# Patient Record
Sex: Male | Born: 1955
Health system: Southern US, Community
[De-identification: ages and names within clinical notes are randomized; demographics above are authoritative.]

## PROBLEM LIST (undated history)

## (undated) DIAGNOSIS — N181 Chronic kidney disease, stage 1: Secondary | ICD-10-CM

## (undated) DIAGNOSIS — I429 Cardiomyopathy, unspecified: Secondary | ICD-10-CM

## (undated) DIAGNOSIS — Z8781 Personal history of (healed) traumatic fracture: Secondary | ICD-10-CM

## (undated) DIAGNOSIS — K219 Gastro-esophageal reflux disease without esophagitis: Secondary | ICD-10-CM

## (undated) DIAGNOSIS — I10 Essential (primary) hypertension: Secondary | ICD-10-CM

## (undated) DIAGNOSIS — K5790 Diverticulosis of intestine, part unspecified, without perforation or abscess without bleeding: Secondary | ICD-10-CM

## (undated) DIAGNOSIS — I639 Cerebral infarction, unspecified: Secondary | ICD-10-CM

## (undated) DIAGNOSIS — I714 Abdominal aortic aneurysm, without rupture, unspecified: Secondary | ICD-10-CM

## (undated) DIAGNOSIS — I219 Acute myocardial infarction, unspecified: Secondary | ICD-10-CM

## (undated) DIAGNOSIS — J439 Emphysema, unspecified: Secondary | ICD-10-CM

## (undated) DIAGNOSIS — I5020 Unspecified systolic (congestive) heart failure: Secondary | ICD-10-CM

## (undated) DIAGNOSIS — D696 Thrombocytopenia, unspecified: Secondary | ICD-10-CM

## (undated) DIAGNOSIS — R748 Abnormal levels of other serum enzymes: Secondary | ICD-10-CM

## (undated) DIAGNOSIS — M5030 Other cervical disc degeneration, unspecified cervical region: Secondary | ICD-10-CM

## (undated) DIAGNOSIS — Z720 Tobacco use: Secondary | ICD-10-CM

## (undated) DIAGNOSIS — R972 Elevated prostate specific antigen [PSA]: Secondary | ICD-10-CM

## (undated) DIAGNOSIS — F419 Anxiety disorder, unspecified: Secondary | ICD-10-CM

## (undated) DIAGNOSIS — R001 Bradycardia, unspecified: Secondary | ICD-10-CM

## (undated) DIAGNOSIS — E785 Hyperlipidemia, unspecified: Secondary | ICD-10-CM

## (undated) DIAGNOSIS — G629 Polyneuropathy, unspecified: Secondary | ICD-10-CM

## (undated) DIAGNOSIS — F1010 Alcohol abuse, uncomplicated: Secondary | ICD-10-CM

## (undated) DIAGNOSIS — S2222XA Fracture of body of sternum, initial encounter for closed fracture: Secondary | ICD-10-CM

## (undated) DIAGNOSIS — I739 Peripheral vascular disease, unspecified: Secondary | ICD-10-CM

## (undated) DIAGNOSIS — C801 Malignant (primary) neoplasm, unspecified: Secondary | ICD-10-CM

## (undated) DIAGNOSIS — I4901 Ventricular fibrillation: Secondary | ICD-10-CM

## (undated) DIAGNOSIS — I2510 Atherosclerotic heart disease of native coronary artery without angina pectoris: Secondary | ICD-10-CM

## (undated) DIAGNOSIS — K7460 Unspecified cirrhosis of liver: Secondary | ICD-10-CM

## (undated) HISTORY — PX: CORONARY ANGIOPLASTY: SHX604

## (undated) HISTORY — DX: Hyperlipidemia, unspecified: E78.5

## (undated) HISTORY — DX: Cerebral infarction, unspecified: I63.9

## (undated) HISTORY — DX: Essential (primary) hypertension: I10

## (undated) HISTORY — PX: PROSTATE BIOPSY: SHX241

## (undated) HISTORY — DX: Malignant (primary) neoplasm, unspecified: C80.1

## (undated) HISTORY — DX: Atherosclerotic heart disease of native coronary artery without angina pectoris: I25.10

## (undated) HISTORY — PX: HERNIA REPAIR: SHX51

## (undated) HISTORY — DX: Acute myocardial infarction, unspecified: I21.9

## (undated) HISTORY — PX: COLONOSCOPY: SHX174

---

## 2005-04-21 DIAGNOSIS — I219 Acute myocardial infarction, unspecified: Secondary | ICD-10-CM

## 2005-04-21 HISTORY — DX: Acute myocardial infarction, unspecified: I21.9

## 2005-11-29 DIAGNOSIS — I2109 ST elevation (STEMI) myocardial infarction involving other coronary artery of anterior wall: Secondary | ICD-10-CM

## 2005-11-29 DIAGNOSIS — I2510 Atherosclerotic heart disease of native coronary artery without angina pectoris: Secondary | ICD-10-CM

## 2005-11-29 DIAGNOSIS — I4901 Ventricular fibrillation: Secondary | ICD-10-CM

## 2005-11-29 HISTORY — DX: Atherosclerotic heart disease of native coronary artery without angina pectoris: I25.10

## 2005-11-29 HISTORY — DX: ST elevation (STEMI) myocardial infarction involving other coronary artery of anterior wall: I21.09

## 2005-11-29 HISTORY — DX: Ventricular fibrillation: I49.01

## 2005-12-08 HISTORY — PX: OTHER SURGICAL HISTORY: SHX169

## 2006-01-02 ENCOUNTER — Ambulatory Visit: Payer: Self-pay | Admitting: Internal Medicine

## 2006-01-19 ENCOUNTER — Ambulatory Visit: Payer: Self-pay | Admitting: General Surgery

## 2010-04-21 DIAGNOSIS — C189 Malignant neoplasm of colon, unspecified: Secondary | ICD-10-CM

## 2010-04-21 DIAGNOSIS — C801 Malignant (primary) neoplasm, unspecified: Secondary | ICD-10-CM

## 2010-04-21 HISTORY — DX: Malignant (primary) neoplasm, unspecified: C80.1

## 2010-04-21 HISTORY — DX: Malignant neoplasm of colon, unspecified: C18.9

## 2010-04-21 HISTORY — PX: PROSTATE SURGERY: SHX751

## 2010-04-21 HISTORY — PX: COLON SURGERY: SHX602

## 2010-05-15 ENCOUNTER — Ambulatory Visit: Payer: Self-pay | Admitting: Internal Medicine

## 2010-06-10 ENCOUNTER — Ambulatory Visit: Payer: Self-pay | Admitting: Gastroenterology

## 2010-06-13 ENCOUNTER — Ambulatory Visit: Payer: Self-pay | Admitting: Gastroenterology

## 2010-06-19 ENCOUNTER — Ambulatory Visit: Payer: Self-pay | Admitting: Surgery

## 2010-06-20 LAB — CEA: CEA: 2 ng/mL (ref 0.0–4.7)

## 2010-06-26 ENCOUNTER — Inpatient Hospital Stay: Payer: Self-pay | Admitting: Surgery

## 2010-07-02 ENCOUNTER — Ambulatory Visit: Payer: Self-pay | Admitting: Internal Medicine

## 2010-07-10 ENCOUNTER — Ambulatory Visit: Payer: Self-pay | Admitting: Surgery

## 2010-07-21 ENCOUNTER — Ambulatory Visit: Payer: Self-pay | Admitting: Internal Medicine

## 2010-07-30 ENCOUNTER — Ambulatory Visit: Payer: Self-pay | Admitting: Internal Medicine

## 2010-08-20 ENCOUNTER — Ambulatory Visit: Payer: Self-pay | Admitting: Internal Medicine

## 2011-06-29 IMAGING — CR DG CHEST 2V
1 series · 2 of 2 positions shown · non-contrast
Comparison: none

REASON FOR EXAM: shortness of breath, increased O2 requirements
COMMENTS:   LMP: (Male)

[Series 1: view not recorded · 0.17mm/px · 2 of 2 slices shown]
[im 1/2]
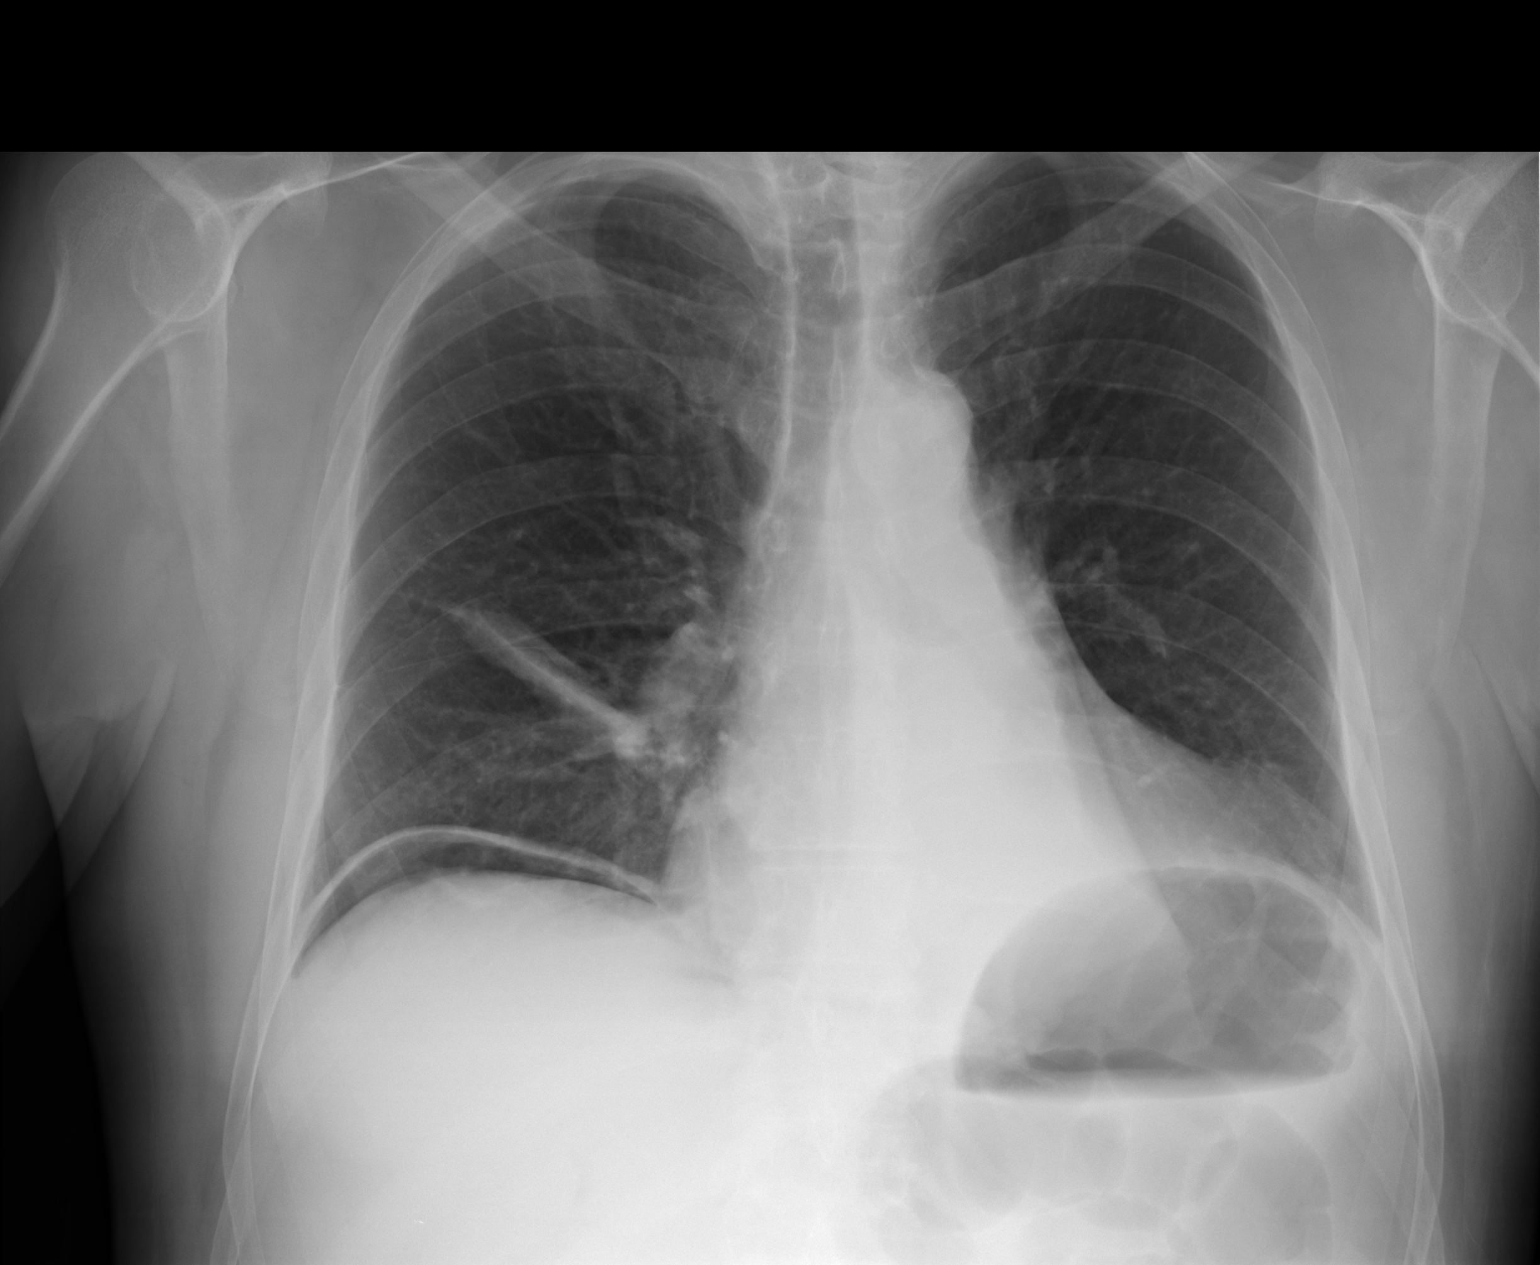
[im 2/2]
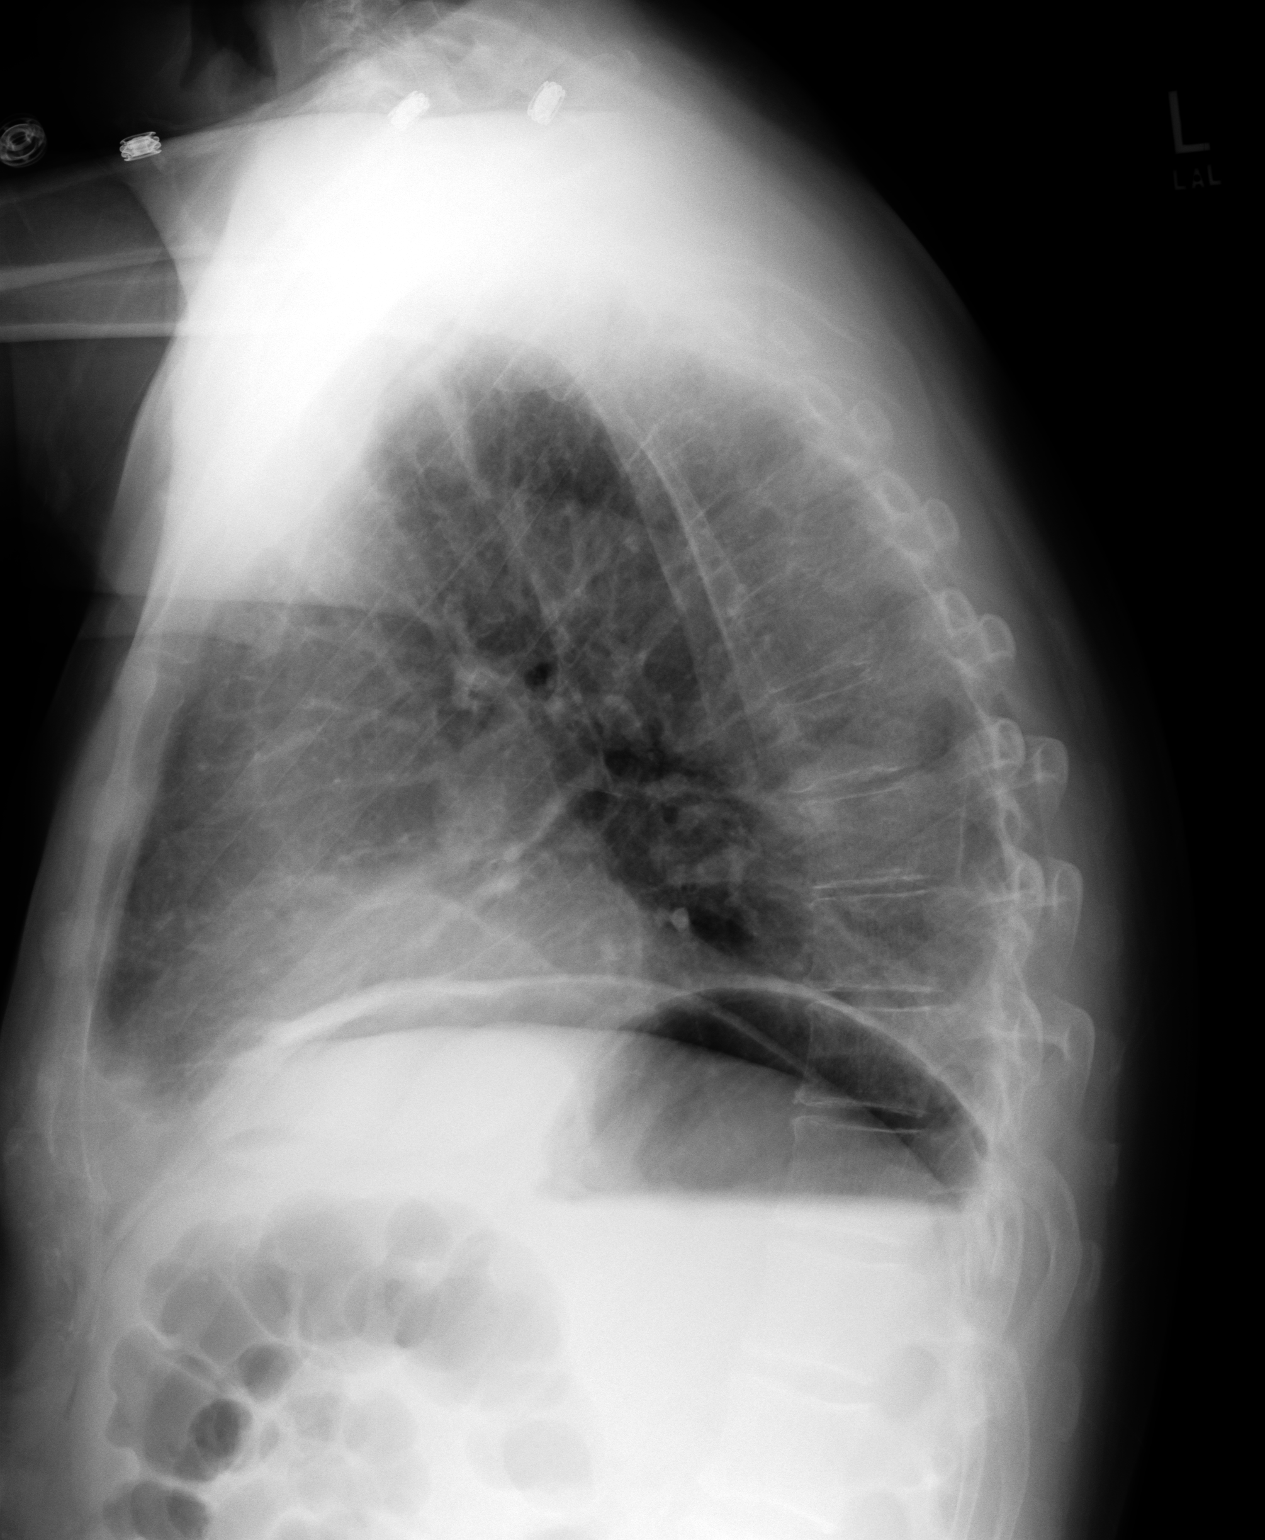

[2 of 2 positions shown; findings below may reference images not displayed]

PROCEDURE:     DXR - DXR CHEST PA (OR AP) AND LATERAL  - June 28, 2010  [DATE]

RESULT:     Erect PA and lateral views the chest demonstrate free air under
the right hemidiaphragm presumably secondary to recent surgery. There is a
band of density in the right hilar region suggestive of atelectasis. Minimal
infiltrate is not excluded. There is no effusion or pneumothorax. The heart
is normal in size.
IMPRESSION: 1. Pneumoperitoneum, possibly secondary to recent surgery. Correlate with
history. Linear atelectasis in the right lung.(*)

## 2013-05-13 ENCOUNTER — Ambulatory Visit: Payer: Self-pay | Admitting: Gastroenterology

## 2013-05-17 LAB — PATHOLOGY REPORT

## 2014-05-22 LAB — PSA

## 2014-11-27 ENCOUNTER — Other Ambulatory Visit: Payer: Self-pay | Admitting: Family Medicine

## 2014-12-17 ENCOUNTER — Other Ambulatory Visit: Payer: Self-pay | Admitting: Family Medicine

## 2014-12-18 NOTE — Telephone Encounter (Signed)
Due for follow up. Will get him enough to get him to PE appt once booked.

## 2014-12-18 NOTE — Telephone Encounter (Signed)
Called and left a message for patient to call and schedule a follow up appointment.

## 2014-12-19 NOTE — Telephone Encounter (Signed)
Pt scheduled for 12/21/14 @ 3:30pm. Thanks.

## 2014-12-21 ENCOUNTER — Ambulatory Visit (INDEPENDENT_AMBULATORY_CARE_PROVIDER_SITE_OTHER): Payer: BLUE CROSS/BLUE SHIELD | Admitting: Family Medicine

## 2014-12-21 ENCOUNTER — Encounter: Payer: Self-pay | Admitting: Family Medicine

## 2014-12-21 VITALS — BP 138/69 | HR 67 | Temp 97.9°F | Ht 72.6 in | Wt 210.6 lb

## 2014-12-21 DIAGNOSIS — Z113 Encounter for screening for infections with a predominantly sexual mode of transmission: Secondary | ICD-10-CM

## 2014-12-21 DIAGNOSIS — G629 Polyneuropathy, unspecified: Secondary | ICD-10-CM | POA: Diagnosis not present

## 2014-12-21 DIAGNOSIS — N184 Chronic kidney disease, stage 4 (severe): Secondary | ICD-10-CM

## 2014-12-21 DIAGNOSIS — N181 Chronic kidney disease, stage 1: Secondary | ICD-10-CM

## 2014-12-21 DIAGNOSIS — N189 Chronic kidney disease, unspecified: Secondary | ICD-10-CM | POA: Diagnosis not present

## 2014-12-21 DIAGNOSIS — I10 Essential (primary) hypertension: Secondary | ICD-10-CM | POA: Insufficient documentation

## 2014-12-21 DIAGNOSIS — T148 Other injury of unspecified body region: Secondary | ICD-10-CM

## 2014-12-21 DIAGNOSIS — N185 Chronic kidney disease, stage 5: Secondary | ICD-10-CM

## 2014-12-21 DIAGNOSIS — N182 Chronic kidney disease, stage 2 (mild): Secondary | ICD-10-CM

## 2014-12-21 DIAGNOSIS — E785 Hyperlipidemia, unspecified: Secondary | ICD-10-CM | POA: Diagnosis not present

## 2014-12-21 DIAGNOSIS — N183 Chronic kidney disease, stage 3 (moderate): Secondary | ICD-10-CM

## 2014-12-21 DIAGNOSIS — R972 Elevated prostate specific antigen [PSA]: Secondary | ICD-10-CM | POA: Insufficient documentation

## 2014-12-21 DIAGNOSIS — I2510 Atherosclerotic heart disease of native coronary artery without angina pectoris: Secondary | ICD-10-CM | POA: Insufficient documentation

## 2014-12-21 DIAGNOSIS — I129 Hypertensive chronic kidney disease with stage 1 through stage 4 chronic kidney disease, or unspecified chronic kidney disease: Secondary | ICD-10-CM

## 2014-12-21 DIAGNOSIS — I25119 Atherosclerotic heart disease of native coronary artery with unspecified angina pectoris: Secondary | ICD-10-CM

## 2014-12-21 DIAGNOSIS — C189 Malignant neoplasm of colon, unspecified: Secondary | ICD-10-CM

## 2014-12-21 DIAGNOSIS — K219 Gastro-esophageal reflux disease without esophagitis: Secondary | ICD-10-CM | POA: Insufficient documentation

## 2014-12-21 DIAGNOSIS — T148XXA Other injury of unspecified body region, initial encounter: Secondary | ICD-10-CM

## 2014-12-21 MED ORDER — METOPROLOL TARTRATE 100 MG PO TABS: 100.0000 mg | ORAL_TABLET | Freq: Two times a day (BID) | ORAL | Status: DC

## 2014-12-21 MED ORDER — DULOXETINE HCL 60 MG PO CPEP: 60.0000 mg | ORAL_CAPSULE | Freq: Every day | ORAL | Status: DC

## 2014-12-21 MED ORDER — NITROGLYCERIN 0.6 MG SL SUBL: 0.6000 mg | SUBLINGUAL_TABLET | SUBLINGUAL | Status: AC | PRN

## 2014-12-21 MED ORDER — SIMVASTATIN 40 MG PO TABS: 40.0000 mg | ORAL_TABLET | Freq: Every day | ORAL | Status: DC

## 2014-12-21 MED ORDER — GABAPENTIN 300 MG PO CAPS: ORAL_CAPSULE | ORAL | Status: DC

## 2014-12-21 MED ORDER — OMEPRAZOLE 40 MG PO CPDR: 40.0000 mg | DELAYED_RELEASE_CAPSULE | Freq: Every day | ORAL | Status: DC

## 2014-12-21 NOTE — Assessment & Plan Note (Signed)
Under good control today. Checking labs. Continue current regimen. Unclear why not on ACE/ARB- will await microalbumin and look back at previous records.

## 2014-12-21 NOTE — Progress Notes (Signed)
BP 138/69 mmHg  Pulse 67  Temp(Src) 97.9 F (36.6 C)  Ht 6' 0.6" (1.844 m)  Wt 210 lb 9.6 oz (95.528 kg)  BMI 28.09 kg/m2  SpO2 98%   Subjective:    Patient ID: Michael Bonilla, male    DOB: [birth year], 59 y.o.   MRN: [id number]  HPI: Michael Bonilla is a 59 y.o. male  Chief Complaint  Patient presents with  . Medication Refill    pt states he has some questions about meds and needs refills  . Insect Bite    pt has tick bite(about a year old) on back and states he doesn't think it healed right   HYPERTENSION / Glide Satisfied with current treatment? yes Duration of hypertension: chronic BP monitoring frequency: not checking BP medication side effects: no Duration of hyperlipidemia: chronic Cholesterol medication side effects: no Cholesterol supplements: fish oil Medication compliance: excellent compliance Aspirin: yes Recent stressors: no Recurrent headaches: no Visual changes: no Palpitations: no Dyspnea: no Chest pain: no Lower extremity edema: no Dizzy/lightheaded: no   NEUROPATHY Neuropathy status:  stable    Satisfied with current treatment?:  yes   Medication side effects:  no   Medication compliance:  excellent compliance   Location:   R leg Pain:  yes tingly  Quality:  burning, pins and needles  Severity:  moderate  Frequency:  intermittent- worse at night than during the day  Bilateral:  no  Symmetric:  no  Numbness:  yes   Decreased sensation:  yes    Weakness:  no    Context:  symptoms are stable.  Alleviating factors:   at night and laying down Aggravating factors:   stretching Treatments attempted:  gabapentin and duloxetine    Taking the gabapentin- helps with the pain, cymbalta also helping  Tick bite about a year ago, has had an area that didn't heal great since then. Would like it looked at.   Relevant past medical, surgical, family and social history reviewed and updated as indicated. Interim medical history since our last  visit reviewed. Allergies and medications reviewed and updated.  Review of Systems  Constitutional: Negative.   Respiratory: Negative.   Cardiovascular: Negative.   Gastrointestinal: Negative.   Skin: Positive for wound. Negative for color change, pallor and rash.  Neurological: Negative.   Psychiatric/Behavioral: Negative.     Per HPI unless specifically indicated above     Objective:    BP 138/69 mmHg  Pulse 67  Temp(Src) 97.9 F (36.6 C)  Ht 6' 0.6" (1.844 m)  Wt 210 lb 9.6 oz (95.528 kg)  BMI 28.09 kg/m2  SpO2 98%  Wt Readings from Last 3 Encounters:  12/21/14 210 lb 9.6 oz (95.528 kg)  06/20/14 203 lb (92.08 kg)    Physical Exam  Constitutional: He is oriented to person, place, and time. He appears well-developed and well-nourished. No distress.  HENT:  Head: Normocephalic and atraumatic.  Right Ear: Hearing normal.  Left Ear: Hearing normal.  Nose: Nose normal.  Eyes: Conjunctivae and lids are normal. Right eye exhibits no discharge. Left eye exhibits no discharge. No scleral icterus.  Cardiovascular: Normal rate, regular rhythm, normal heart sounds and intact distal pulses.  Exam reveals no gallop and no friction rub.   No murmur heard. Pulmonary/Chest: Effort normal and breath sounds normal. No respiratory distress. He has no wheezes. He has no rales. He exhibits no tenderness.  Musculoskeletal: Normal range of motion.  Neurological: He is alert and oriented to person, place,  and time.  Skin: Skin is warm, dry and intact. No rash noted. No erythema. No pallor.  Excoriated area in center of upper back, no sign of infection, healing well  Psychiatric: He has a normal mood and affect. His speech is normal and behavior is normal. Judgment and thought content normal. Cognition and memory are normal.  Nursing note and vitals reviewed.   Results for orders placed or performed in visit on 12/21/14  PSA  Result Value Ref Range   PSA from PP       Assessment &  Plan:   Problem List Items Addressed This Visit      Cardiovascular and Mediastinum   Essential hypertension   Relevant Medications   metoprolol (LOPRESSOR) 100 MG tablet   nitroGLYCERIN (NITROSTAT) 0.6 MG SL tablet   simvastatin (ZOCOR) 40 MG tablet   Other Relevant Orders   Comprehensive metabolic panel   CBC With Differential/Platelet   Microalbumin, Urine Waived   TSH     Nervous and Auditory   Peripheral neuropathy    Under good control on current regimen. Continue current regimen. Continue to monitor. Refill given today.       Relevant Medications   DULoxetine (CYMBALTA) 60 MG capsule   gabapentin (NEURONTIN) 300 MG capsule     Genitourinary   Hypertensive CKD (chronic kidney disease) - Primary    Under good control today. Checking labs. Continue current regimen. Unclear why not on ACE/ARB- will await microalbumin and look back at previous records.         Other   Hyperlipidemia    Will recheck today. Continue current regimen for now. Refills given. Continue to monitor.       Relevant Medications   metoprolol (LOPRESSOR) 100 MG tablet   nitroGLYCERIN (NITROSTAT) 0.6 MG SL tablet   simvastatin (ZOCOR) 40 MG tablet   Other Relevant Orders   Lipid Panel Piccolo, Waived   Comprehensive metabolic panel   Elevated prostate specific antigen (PSA)    Will recheck today.        Other Visit Diagnoses    Elevated PSA        Relevant Orders    CBC With Differential/Platelet    PSA    Routine screening for STI (sexually transmitted infection)        Hep C checked today due to age.     Relevant Orders    Hepatitis C Antibody    Excoriation        Advised patient to use neosporin and bandaid and not scratch it. If not getting better, let us know.         Follow up plan: Return in about 6 months (around 06/20/2015) for PE.

## 2014-12-21 NOTE — Patient Instructions (Signed)

## 2014-12-21 NOTE — Assessment & Plan Note (Signed)
Will recheck today.

## 2014-12-21 NOTE — Assessment & Plan Note (Signed)
Under good control on current regimen. Continue current regimen. Continue to monitor. Refill given today.

## 2014-12-21 NOTE — Assessment & Plan Note (Signed)
Will recheck today. Continue current regimen for now. Refills given. Continue to monitor.

## 2014-12-22 LAB — COMPREHENSIVE METABOLIC PANEL
A/G RATIO: 1.8 (ref 1.1–2.5)
ALBUMIN: 4.1 g/dL (ref 3.6–4.8)
ALT: 55 IU/L — ABNORMAL HIGH (ref 0–44)
AST: 51 IU/L — ABNORMAL HIGH (ref 0–40)
Alkaline Phosphatase: 80 IU/L (ref 39–117)
BILIRUBIN TOTAL: 0.4 mg/dL (ref 0.0–1.2)
BUN / CREAT RATIO: 10 (ref 10–22)
BUN: 12 mg/dL (ref 8–27)
CALCIUM: 9.3 mg/dL (ref 8.6–10.2)
CHLORIDE: 101 mmol/L (ref 97–108)
CO2: 25 mmol/L (ref 18–29)
Creatinine, Ser: 1.16 mg/dL (ref 0.76–1.27)
GFR, EST AFRICAN AMERICAN: 79 mL/min/{1.73_m2} (ref >59)
GFR, EST NON AFRICAN AMERICAN: 68 mL/min/{1.73_m2} (ref >59)
GLOBULIN, TOTAL: 2.3 g/dL (ref 1.5–4.5)
Glucose: 88 mg/dL (ref 65–99)
POTASSIUM: 3.9 mmol/L (ref 3.5–5.2)
SODIUM: 142 mmol/L (ref 134–144)
TOTAL PROTEIN: 6.4 g/dL (ref 6.0–8.5)

## 2014-12-22 LAB — CBC WITH DIFFERENTIAL/PLATELET
HEMATOCRIT: 44.7 % (ref 37.5–51.0)
Hemoglobin: 15.1 g/dL (ref 12.6–17.7)
LYMPHS ABS: 1.5 10*3/uL (ref 0.7–3.1)
LYMPHS: 22 %
MCH: 32.5 pg (ref 26.6–33.0)
MCHC: 33.8 g/dL (ref 31.5–35.7)
MCV: 96 fL (ref 79–97)
MID (ABSOLUTE): 0.8 10*3/uL (ref 0.1–1.6)
MID: 11 %
Neutrophils Absolute: 4.8 10*3/uL (ref 1.4–7.0)
Neutrophils: 67 %
Platelets: 124 10*3/uL — ABNORMAL LOW (ref 150–379)
RBC: 4.65 x10E6/uL (ref 4.14–5.80)
RDW: 14.1 % (ref 12.3–15.4)
WBC: 7.1 10*3/uL (ref 3.4–10.8)

## 2014-12-22 LAB — PSA: Prostate Specific Ag, Serum: 5.6 ng/mL — ABNORMAL HIGH (ref 0.0–4.0)

## 2014-12-22 LAB — MICROALBUMIN, URINE WAIVED
CREATININE, URINE WAIVED: 200 mg/dL (ref 10–300)
Microalb, Ur Waived: 30 mg/L — ABNORMAL HIGH (ref 0–19)

## 2014-12-22 LAB — HEPATITIS C ANTIBODY

## 2014-12-22 LAB — TSH: TSH: 1.71 u[IU]/mL (ref 0.450–4.500)

## 2014-12-23 LAB — LIPID PANEL W/O CHOL/HDL RATIO
CHOLESTEROL TOTAL: 140 mg/dL (ref 100–199)
HDL: 29 mg/dL — AB (ref >39)
LDL CALC: 32 mg/dL (ref 0–99)
TRIGLYCERIDES: 394 mg/dL — AB (ref 0–149)
VLDL CHOLESTEROL CAL: 79 mg/dL — AB (ref 5–40)

## 2014-12-29 LAB — SPECIMEN STATUS REPORT

## 2015-01-01 ENCOUNTER — Telehealth: Payer: Self-pay | Admitting: Family Medicine

## 2015-01-01 NOTE — Telephone Encounter (Signed)
Discussed results. Just got 90 day supply of simvastatin, so doesn't want to change. Will work on diet and exercise for 6 months and will recheck, if still high, will change medicine.

## 2015-01-03 ENCOUNTER — Telehealth: Payer: Self-pay | Admitting: Family Medicine

## 2015-01-03 NOTE — Telephone Encounter (Signed)
Spoke with patient, he had a question about getting a medication for a painful achilles tendon, patient has never mentioned this to the provider, so he was scheduled for an appointment.

## 2015-01-03 NOTE — Telephone Encounter (Signed)
Pt had questions about medications and would like a call back for clarification

## 2015-01-12 ENCOUNTER — Ambulatory Visit (INDEPENDENT_AMBULATORY_CARE_PROVIDER_SITE_OTHER): Payer: BLUE CROSS/BLUE SHIELD | Admitting: Family Medicine

## 2015-01-12 ENCOUNTER — Encounter: Payer: Self-pay | Admitting: Family Medicine

## 2015-01-12 VITALS — BP 137/80 | HR 72 | Temp 97.9°F | Ht 72.0 in | Wt 210.0 lb

## 2015-01-12 DIAGNOSIS — M7662 Achilles tendinitis, left leg: Secondary | ICD-10-CM

## 2015-01-12 DIAGNOSIS — Z23 Encounter for immunization: Secondary | ICD-10-CM | POA: Diagnosis not present

## 2015-01-12 DIAGNOSIS — R609 Edema, unspecified: Secondary | ICD-10-CM

## 2015-01-12 MED ORDER — DICLOFENAC SODIUM 1 % TD GEL
4.0000 g | Freq: Four times a day (QID) | TRANSDERMAL | Status: DC
Start: 2015-01-12 — End: 2015-07-03

## 2015-01-12 NOTE — Progress Notes (Signed)
BP 137/80 mmHg  Pulse 72  Temp(Src) 97.9 F (36.6 C)  Ht 6' (1.829 m)  Wt 210 lb (95.255 kg)  BMI 28.47 kg/m2  SpO2 98%   Subjective:    Patient ID: Michael Bonilla, male    DOB: 30-Nov-1955, 59 y.o.   MRN: [id number]  HPI: Michael Bonilla is a 59 y.o. male  Chief Complaint  Patient presents with  . Foot Pain    patient complains of achilles pain left   FOOT PAIN- feels really tight in his achilles tendon Duration: about a month Involved foot: left Mechanism of injury: unknown Location: L achilles tendon Onset: gradual  Severity: mild  Quality:  dull, aching and pulling Frequency: intermittent worse as the day goes on Radiation: no Aggravating factors: weight bearing and walking  Alleviating factors: ice, HEP and NSAIDs  Status: worse Treatments attempted: rest, ice, APAP, ibuprofen, aleve and HEP  Relief with NSAIDs?:  mild Weakness with weight bearing or walking: no Morning stiffness: no Swelling: yes Redness: no Bruising: no Paresthesias / decreased sensation: no  Fevers:no  Relevant past medical, surgical, family and social history reviewed and updated as indicated. Interim medical history since our last visit reviewed. Allergies and medications reviewed and updated.  Review of Systems  Constitutional: Negative.   Respiratory: Negative.   Cardiovascular: Negative.   Musculoskeletal: Positive for myalgias. Negative for back pain, joint swelling, arthralgias, gait problem and neck stiffness.  Skin: Negative.   Psychiatric/Behavioral: Negative.     Per HPI unless specifically indicated above     Objective:    BP 137/80 mmHg  Pulse 72  Temp(Src) 97.9 F (36.6 C)  Ht 6' (1.829 m)  Wt 210 lb (95.255 kg)  BMI 28.47 kg/m2  SpO2 98%  Wt Readings from Last 3 Encounters:  01/12/15 210 lb (95.255 kg)  12/21/14 210 lb 9.6 oz (95.528 kg)  06/20/14 203 lb (92.08 kg)    Physical Exam  Constitutional: He is oriented to person, place, and time. He appears  well-developed and well-nourished. No distress.  HENT:  Head: Normocephalic and atraumatic.  Right Ear: Hearing normal.  Left Ear: Hearing normal.  Nose: Nose normal.  Eyes: Conjunctivae and lids are normal. Right eye exhibits no discharge. Left eye exhibits no discharge. No scleral icterus.  Pulmonary/Chest: Effort normal. No respiratory distress.  Musculoskeletal: Normal range of motion.  Superficial phlebitis on L ankle, trace edema, FROM, tenderness to palpation of the plantar fascia and the achilles tendon, + squeeze test to achilles, not to calf, negative Homan's  Neurological: He is alert and oriented to person, place, and time.  Skin: Skin is warm, dry and intact. No rash noted. No erythema. No pallor.  Psychiatric: He has a normal mood and affect. His speech is normal and behavior is normal. Judgment and thought content normal. Cognition and memory are normal.  Nursing note and vitals reviewed.   Results for orders placed or performed in visit on 12/21/14  Comprehensive metabolic panel  Result Value Ref Range   Glucose 88 65 - 99 mg/dL   BUN 12 8 - 27 mg/dL   Creatinine, Ser 1.16 0.76 - 1.27 mg/dL   GFR calc non Af Amer 68 >59 mL/min/1.73   GFR calc Af Amer 79 >59 mL/min/1.73   BUN/Creatinine Ratio 10 10 - 22   Sodium 142 134 - 144 mmol/L   Potassium 3.9 3.5 - 5.2 mmol/L   Chloride 101 97 - 108 mmol/L   CO2 25 18 - 29 mmol/L  Calcium 9.3 8.6 - 10.2 mg/dL   Total Protein 6.4 6.0 - 8.5 g/dL   Albumin 4.1 3.6 - 4.8 g/dL   Globulin, Total 2.3 1.5 - 4.5 g/dL   Albumin/Globulin Ratio 1.8 1.1 - 2.5   Bilirubin Total 0.4 0.0 - 1.2 mg/dL   Alkaline Phosphatase 80 39 - 117 IU/L   AST 51 (H) 0 - 40 IU/L   ALT 55 (H) 0 - 44 IU/L  CBC With Differential/Platelet  Result Value Ref Range   WBC 7.1 3.4 - 10.8 x10E3/uL   RBC 4.65 4.14 - 5.80 x10E6/uL   Hemoglobin 15.1 12.6 - 17.7 g/dL   Hematocrit 44.7 37.5 - 51.0 %   MCV 96 79 - 97 fL   MCH 32.5 26.6 - 33.0 pg   MCHC 33.8 31.5  - 35.7 g/dL   RDW 14.1 12.3 - 15.4 %   Platelets 124 (L) 150 - 379 x10E3/uL   Neutrophils 67 %   Lymphs 22 %   MID 11 %   Neutrophils Absolute 4.8 1.4 - 7.0 x10E3/uL   Lymphocytes Absolute 1.5 0.7 - 3.1 x10E3/uL   MID (Absolute) 0.8 0.1 - 1.6 X10E3/uL  Microalbumin, Urine Waived  Result Value Ref Range   Microalb, Ur Waived 30 (H) 0 - 19 mg/L   Creatinine, Urine Waived 200 10 - 300 mg/dL   Microalb/Creat Ratio <30 <30 mg/g  TSH  Result Value Ref Range   TSH 1.710 0.450 - 4.500 uIU/mL  PSA  Result Value Ref Range   Prostate Specific Ag, Serum 5.6 (H) 0.0 - 4.0 ng/mL  Hepatitis C Antibody  Result Value Ref Range   Hep C Virus Ab <0.1 0.0 - 0.9 s/co ratio  Lipid Panel w/o Chol/HDL Ratio  Result Value Ref Range   Cholesterol, Total 140 100 - 199 mg/dL   Triglycerides 394 (H) 0 - 149 mg/dL   HDL 29 (L) >39 mg/dL   VLDL Cholesterol Cal 79 (H) 5 - 40 mg/dL   LDL Calculated 32 0 - 99 mg/dL  Specimen status report  Result Value Ref Range   specimen status report Comment       Assessment & Plan:   Problem List Items Addressed This Visit    None    Visit Diagnoses    Achilles tendinitis of left lower extremity    -  Primary    Will do stretches, voltaren gel and ice. Monitor and recheck in 2-3 weeks.     Immunization due        Flu shot given today.     Relevant Orders    Flu Vaccine QUAD 36+ mos PF IM (Fluarix & Fluzone Quad PF) (Completed)    Edema        Will start with compression stockings and will see how he does in 2-3 weeks.         Follow up plan: Return in about 3 weeks (around 02/02/2015).

## 2015-01-12 NOTE — Patient Instructions (Signed)
Achilles tendonitis from Patient Sports Medicine Advisor given.

## 2015-01-15 ENCOUNTER — Telehealth: Payer: Self-pay | Admitting: Family Medicine

## 2015-01-15 MED ORDER — DICLOFENAC SODIUM 75 MG PO TBEC: 75.0000 mg | DELAYED_RELEASE_TABLET | Freq: Two times a day (BID) | ORAL | Status: DC

## 2015-01-15 NOTE — Telephone Encounter (Signed)
Pt calling to check status of medication change request. Thanks.

## 2015-01-15 NOTE — Telephone Encounter (Signed)
Pt informed medication has been sent. I did not see a note in the original encounter. I apologize. Thanks.

## 2015-01-15 NOTE — Telephone Encounter (Signed)
Rx sent to his pharmacy until the PA is approved. Please let him know. Thanks!

## 2015-01-15 NOTE — Telephone Encounter (Signed)
Please let patient know that I left him a voicemail stating that she sent in a medication for him until the other is approved, written in last phone encounter.

## 2015-01-15 NOTE — Telephone Encounter (Signed)
Forward to provider

## 2015-01-15 NOTE — Telephone Encounter (Signed)
Pt called wanted to know if something different can be called in for his tendon. Pt stated RX that was written needs a prior authorization which can take several days. Wants to know if some sort of anti-inflammatory pill can sent to his pharmacy. Pharm is [facility] in [place]. Thanks.

## 2015-01-15 NOTE — Telephone Encounter (Signed)
Patient notified

## 2015-01-25 ENCOUNTER — Telehealth: Payer: Self-pay | Admitting: Family Medicine

## 2015-01-25 NOTE — Telephone Encounter (Signed)
Dan from [facility] called stated a PA is needed for pt's Voltaren gel RX. Pharm is [facility] in [place]. Thanks.

## 2015-01-30 NOTE — Telephone Encounter (Signed)
ok 

## 2015-03-03 ENCOUNTER — Other Ambulatory Visit: Payer: Self-pay | Admitting: Family Medicine

## 2015-05-08 ENCOUNTER — Other Ambulatory Visit: Payer: Self-pay | Admitting: Family Medicine

## 2015-06-22 ENCOUNTER — Other Ambulatory Visit: Payer: Self-pay | Admitting: Family Medicine

## 2015-06-29 ENCOUNTER — Encounter: Payer: BLUE CROSS/BLUE SHIELD | Admitting: Family Medicine

## 2015-07-03 ENCOUNTER — Encounter: Payer: Self-pay | Admitting: Family Medicine

## 2015-07-03 ENCOUNTER — Ambulatory Visit (INDEPENDENT_AMBULATORY_CARE_PROVIDER_SITE_OTHER): Payer: BLUE CROSS/BLUE SHIELD | Admitting: Family Medicine

## 2015-07-03 VITALS — BP 146/81 | HR 72 | Temp 97.9°F | Ht 71.6 in | Wt 215.0 lb

## 2015-07-03 DIAGNOSIS — G6289 Other specified polyneuropathies: Secondary | ICD-10-CM

## 2015-07-03 DIAGNOSIS — J029 Acute pharyngitis, unspecified: Secondary | ICD-10-CM | POA: Diagnosis not present

## 2015-07-03 MED ORDER — PREGABALIN 75 MG PO CAPS: ORAL_CAPSULE | ORAL | Status: DC

## 2015-07-03 MED ORDER — FEXOFENADINE HCL 180 MG PO TABS: 180.0000 mg | ORAL_TABLET | Freq: Every day | ORAL | Status: DC

## 2015-07-03 NOTE — Assessment & Plan Note (Signed)
Will start him on lyrica. Work on increasing. Recheck 1 month to continue titration as needed.

## 2015-07-03 NOTE — Progress Notes (Signed)
BP 146/81 mmHg  Pulse 72  Temp(Src) 97.9 F (36.6 C)  Ht 5' 11.6" (1.819 m)  Wt 215 lb (97.523 kg)  BMI 29.47 kg/m2  SpO2 99%   Subjective:    Patient ID: Michael Bonilla, male    DOB: 08/26/1955, 60 y.o.   MRN: [id number]  HPI: Michael Bonilla is a 60 y.o. male  Chief Complaint  Patient presents with  . Sore Throat  . Leg Pain   UPPER RESPIRATORY TRACT INFECTION- grandson had strep about a week ago Duration: 1-2 weeks Worst symptom: sore throat, tight and tender Fever: yes Cough: no Shortness of breath: no Wheezing: no Chest pain: no Chest tightness: no Chest congestion: no Nasal congestion: yes Runny nose: yes Post nasal drip: yes Sneezing: no Sore throat: yes Swollen glands: no Sinus pressure: yes Headache: yes Face pain: no Toothache: no Ear pain: yes left Ear pressure: no  Eyes red/itching:yes Eye drainage/crusting: no  Vomiting: no Rash: no Fatigue: yes Sick contacts: yes Strep contacts: yes  Context: worse Recurrent sinusitis: no Relief with OTC cold/cough medications: no  Treatments attempted: anti-histamine   Leg pain has been worse. Has been burning all the time. Has a spot which he rubs and pushes on and it doesn't get any better. He feels like they cymbalta is helping, but not much. Would like to start something else. Will start lyrica.   Relevant past medical, surgical, family and social history reviewed and updated as indicated. Interim medical history since our last visit reviewed. Allergies and medications reviewed and updated.  Review of Systems  Constitutional: Negative.   HENT: Positive for congestion, postnasal drip, rhinorrhea, sinus pressure and sore throat. Negative for dental problem, drooling, ear discharge, ear pain, facial swelling, hearing loss, mouth sores, nosebleeds, sneezing, tinnitus, trouble swallowing and voice change.   Respiratory: Negative.   Cardiovascular: Negative.   Musculoskeletal: Positive for arthralgias  and gait problem. Negative for myalgias, back pain, joint swelling, neck pain and neck stiffness.  Neurological: Positive for numbness. Negative for dizziness, tremors, seizures, syncope, facial asymmetry, speech difficulty, weakness and headaches.  Psychiatric/Behavioral: Negative.     Per HPI unless specifically indicated above     Objective:    BP 146/81 mmHg  Pulse 72  Temp(Src) 97.9 F (36.6 C)  Ht 5' 11.6" (1.819 m)  Wt 215 lb (97.523 kg)  BMI 29.47 kg/m2  SpO2 99%  Wt Readings from Last 3 Encounters:  07/03/15 215 lb (97.523 kg)  01/12/15 210 lb (95.255 kg)  12/21/14 210 lb 9.6 oz (95.528 kg)    Physical Exam  Constitutional: He is oriented to person, place, and time. He appears well-developed and well-nourished. No distress.  HENT:  Head: Normocephalic and atraumatic.  Right Ear: Hearing, tympanic membrane, external ear and ear canal normal.  Left Ear: Hearing, tympanic membrane, external ear and ear canal normal.  Nose: Mucosal edema and rhinorrhea present.  Mouth/Throat: Uvula is midline and mucous membranes are normal. Posterior oropharyngeal edema and posterior oropharyngeal erythema present. No oropharyngeal exudate.  Eyes: Conjunctivae, EOM and lids are normal. Pupils are equal, round, and reactive to light. Right eye exhibits no discharge. Left eye exhibits no discharge. No scleral icterus.  Neck: Normal range of motion. Neck supple. No JVD present. No tracheal deviation present. No thyromegaly present.  Cardiovascular: Normal rate, regular rhythm, normal heart sounds and intact distal pulses.  Exam reveals no gallop and no friction rub.   No murmur heard. Pulmonary/Chest: Effort normal and breath sounds normal.  No stridor. No respiratory distress. He has no wheezes. He has no rales. He exhibits no tenderness.  Musculoskeletal: Normal range of motion.  Lymphadenopathy:    He has cervical adenopathy.  Neurological: He is alert and oriented to person, place, and  time.  Skin: Skin is warm, dry and intact. No rash noted. He is not diaphoretic. No erythema. No pallor.  Psychiatric: He has a normal mood and affect. His speech is normal and behavior is normal. Judgment and thought content normal. Cognition and memory are normal.  Nursing note and vitals reviewed.   Results for orders placed or performed in visit on 12/21/14  Comprehensive metabolic panel  Result Value Ref Range   Glucose 88 65 - 99 mg/dL   BUN 12 8 - 27 mg/dL   Creatinine, Ser 1.16 0.76 - 1.27 mg/dL   GFR calc non Af Amer 68 >59 mL/min/1.73   GFR calc Af Amer 79 >59 mL/min/1.73   BUN/Creatinine Ratio 10 10 - 22   Sodium 142 134 - 144 mmol/L   Potassium 3.9 3.5 - 5.2 mmol/L   Chloride 101 97 - 108 mmol/L   CO2 25 18 - 29 mmol/L   Calcium 9.3 8.6 - 10.2 mg/dL   Total Protein 6.4 6.0 - 8.5 g/dL   Albumin 4.1 3.6 - 4.8 g/dL   Globulin, Total 2.3 1.5 - 4.5 g/dL   Albumin/Globulin Ratio 1.8 1.1 - 2.5   Bilirubin Total 0.4 0.0 - 1.2 mg/dL   Alkaline Phosphatase 80 39 - 117 IU/L   AST 51 (H) 0 - 40 IU/L   ALT 55 (H) 0 - 44 IU/L  CBC With Differential/Platelet  Result Value Ref Range   WBC 7.1 3.4 - 10.8 x10E3/uL   RBC 4.65 4.14 - 5.80 x10E6/uL   Hemoglobin 15.1 12.6 - 17.7 g/dL   Hematocrit 44.7 37.5 - 51.0 %   MCV 96 79 - 97 fL   MCH 32.5 26.6 - 33.0 pg   MCHC 33.8 31.5 - 35.7 g/dL   RDW 14.1 12.3 - 15.4 %   Platelets 124 (L) 150 - 379 x10E3/uL   Neutrophils 67 %   Lymphs 22 %   MID 11 %   Neutrophils Absolute 4.8 1.4 - 7.0 x10E3/uL   Lymphocytes Absolute 1.5 0.7 - 3.1 x10E3/uL   MID (Absolute) 0.8 0.1 - 1.6 X10E3/uL  Microalbumin, Urine Waived  Result Value Ref Range   Microalb, Ur Waived 30 (H) 0 - 19 mg/L   Creatinine, Urine Waived 200 10 - 300 mg/dL   Microalb/Creat Ratio <30 <30 mg/g  TSH  Result Value Ref Range   TSH 1.710 0.450 - 4.500 uIU/mL  PSA  Result Value Ref Range   Prostate Specific Ag, Serum 5.6 (H) 0.0 - 4.0 ng/mL  Hepatitis C Antibody  Result  Value Ref Range   Hep C Virus Ab <0.1 0.0 - 0.9 s/co ratio  Lipid Panel w/o Chol/HDL Ratio  Result Value Ref Range   Cholesterol, Total 140 100 - 199 mg/dL   Triglycerides 394 (H) 0 - 149 mg/dL   HDL 29 (L) >39 mg/dL   VLDL Cholesterol Cal 79 (H) 5 - 40 mg/dL   LDL Calculated 32 0 - 99 mg/dL  Specimen status report  Result Value Ref Range   specimen status report Comment       Assessment & Plan:   Problem List Items Addressed This Visit      Nervous and Auditory   Peripheral neuropathy (HCC)  Will start him on lyrica. Work on increasing. Recheck 1 month to continue titration as needed.       Relevant Medications   pregabalin (LYRICA) 75 MG capsule    Other Visit Diagnoses    Sore throat    -  Primary    Strep negative. Likely allergies. Will start allegra. If not better in 1 week, will let us know.     Relevant Orders    Rapid strep screen (not at [facility])        Follow up plan: Return in about 4 weeks (around 07/31/2015) for follow up on leg pain.

## 2015-07-03 NOTE — Patient Instructions (Signed)
Pregabalin capsules What is this medicine? PREGABALIN (pre GAB a lin) is used to treat nerve pain from diabetes, shingles, spinal cord injury, and fibromyalgia. It is also used to control seizures in epilepsy. This medicine may be used for other purposes; ask your health care provider or pharmacist if you have questions. What should I tell my health care provider before I take this medicine? They need to know if you have any of these conditions: -bleeding problems -heart disease, including heart failure -history of alcohol or drug abuse -kidney disease -suicidal thoughts, plans, or attempt; a previous suicide attempt by you or a family member -an unusual or allergic reaction to pregabalin, gabapentin, other medicines, foods, dyes, or preservatives -pregnant or trying to get pregnant or trying to conceive with your partner -breast-feeding How should I use this medicine? Take this medicine by mouth with a glass of water. Follow the directions on the prescription label. You can take this medicine with or without food. Take your doses at regular intervals. Do not take your medicine more often than directed. Do not stop taking except on your doctor's advice. A special MedGuide will be given to you by the pharmacist with each prescription and refill. Be sure to read this information carefully each time. Talk to your pediatrician regarding the use of this medicine in children. Special care may be needed. Overdosage: If you think you have taken too much of this medicine contact a poison control center or emergency room at once. NOTE: This medicine is only for you. Do not share this medicine with others. What if I miss a dose? If you miss a dose, take it as soon as you can. If it is almost time for your next dose, take only that dose. Do not take double or extra doses. What may interact with this medicine? -alcohol -certain medicines for blood pressure like captopril, enalapril, or  lisinopril -certain medicines for diabetes, like pioglitazone or rosiglitazone -certain medicines for anxiety or sleep -narcotic medicines for pain This list may not describe all possible interactions. Give your health care provider a list of all the medicines, herbs, non-prescription drugs, or dietary supplements you use. Also tell them if you smoke, drink alcohol, or use illegal drugs. Some items may interact with your medicine. What should I watch for while using this medicine? Tell your doctor or healthcare professional if your symptoms do not start to get better or if they get worse. Visit your doctor or health care professional for regular checks on your progress. Do not stop taking except on your doctor's advice. You may develop a severe reaction. Your doctor will tell you how much medicine to take. Wear a medical identification bracelet or chain if you are taking this medicine for seizures, and carry a card that describes your disease and details of your medicine and dosage times. You may get drowsy or dizzy. Do not drive, use machinery, or do anything that needs mental alertness until you know how this medicine affects you. Do not stand or sit up quickly, especially if you are an older patient. This reduces the risk of dizzy or fainting spells. Alcohol may interfere with the effect of this medicine. Avoid alcoholic drinks. If you have a heart condition, like congestive heart failure, and notice that you are retaining water and have swelling in your hands or feet, contact your health care provider immediately. The use of this medicine may increase the chance of suicidal thoughts or actions. Pay special attention to how you are   responding while on this medicine. Any worsening of mood, or thoughts of suicide or dying should be reported to your health care professional right away. This medicine has caused reduced sperm counts in some men. This may interfere with the ability to father a child. You  should talk to your doctor or health care professional if you are concerned about your fertility. Women who become pregnant while using this medicine for seizures may enroll in the [place] Antiepileptic Drug Pregnancy Registry by calling 1-888-233-2334. This registry collects information about the safety of antiepileptic drug use during pregnancy. What side effects may I notice from receiving this medicine? Side effects that you should report to your doctor or health care professional as soon as possible: -allergic reactions like skin rash, itching or hives, swelling of the face, lips, or tongue -breathing problems -changes in vision -chest pain -confusion -jerking or unusual movements of any part of your body -loss of memory -muscle pain, tenderness, or weakness -suicidal thoughts or other mood changes -swelling of the ankles, feet, hands -unusual bruising or bleeding Side effects that usually do not require medical attention (Report these to your doctor or health care professional if they continue or are bothersome.): -dizziness -drowsiness -dry mouth -headache -nausea -tremors -trouble sleeping -weight gain This list may not describe all possible side effects. Call your doctor for medical advice about side effects. You may report side effects to FDA at 1-800-FDA-1088. Where should I keep my medicine? Keep out of the reach of children. This medicine can be abused. Keep your medicine in a safe place to protect it from theft. Do not share this medicine with anyone. Selling or giving away this medicine is dangerous and against the law. This medicine may cause accidental overdose and death if it taken by other adults, children, or pets. Mix any unused medicine with a substance like cat litter or coffee grounds. Then throw the medicine away in a sealed container like a sealed bag or a coffee can with a lid. Do not use the medicine after the expiration date. Store at room temperature  between 15 and 30 degrees C (59 and 86 degrees F). NOTE: This sheet is a summary. It may not cover all possible information. If you have questions about this medicine, talk to your doctor, pharmacist, or health care provider.    2016, [employer]. (2013-06-03 15:38:53)  

## 2015-07-04 ENCOUNTER — Telehealth: Payer: Self-pay | Admitting: Family Medicine

## 2015-07-04 NOTE — Telephone Encounter (Signed)
pts wife called and would like to know if the pt should be still taking certain medications.

## 2015-07-05 LAB — CULTURE, GROUP A STREP: STREP A CULTURE: NEGATIVE

## 2015-07-05 LAB — RAPID STREP SCREEN (MED CTR MEBANE ONLY): Strep Gp A Ag, IA W/Reflex: NEGATIVE

## 2015-07-05 NOTE — Telephone Encounter (Signed)
Patients wife notified

## 2015-07-05 NOTE — Telephone Encounter (Signed)
Is patient to continue taking the Cymbalta and Gabapentin with the Lyrica

## 2015-07-05 NOTE — Telephone Encounter (Signed)
Yes. Because he's not better on the other 2

## 2015-07-11 ENCOUNTER — Other Ambulatory Visit: Payer: Self-pay | Admitting: Family Medicine

## 2015-08-03 ENCOUNTER — Ambulatory Visit: Payer: BLUE CROSS/BLUE SHIELD | Admitting: Family Medicine

## 2015-08-05 ENCOUNTER — Other Ambulatory Visit: Payer: Self-pay | Admitting: Family Medicine

## 2015-08-07 ENCOUNTER — Ambulatory Visit (INDEPENDENT_AMBULATORY_CARE_PROVIDER_SITE_OTHER): Payer: BLUE CROSS/BLUE SHIELD | Admitting: Family Medicine

## 2015-08-07 ENCOUNTER — Encounter: Payer: Self-pay | Admitting: Family Medicine

## 2015-08-07 VITALS — BP 130/78 | HR 68 | Temp 98.6°F | Ht 71.6 in | Wt 215.0 lb

## 2015-08-07 DIAGNOSIS — G6289 Other specified polyneuropathies: Secondary | ICD-10-CM

## 2015-08-07 MED ORDER — PREGABALIN 150 MG PO CAPS: 150.0000 mg | ORAL_CAPSULE | Freq: Two times a day (BID) | ORAL | Status: DC

## 2015-08-07 NOTE — Patient Instructions (Signed)
Week 1: 2 capsules in AM, 3 in Afternoon and at bedtime Week 2: 2 capsules in AM and in Afternoon and 3 capsules at bedtime Week 3: 2 capsules in AM and in Afternoon and at bedtime Week 4: 1 capsules in AM, 2 in Afternoon and at bedtime Week 5: 1 capsules in AM and in Afternoon and 2 capsules at bedtime Week 6: 1 capsules in AM and in Afternoon and at bedtime Week 7: Nothing in AM, 1 in Afternoon and at bedtime Week 8: Nothing in the AM Afternoon and 1 capsules at bedtime Week 9:  STOP GABAPENTIN

## 2015-08-07 NOTE — Assessment & Plan Note (Signed)
Will increase his lyrica. Will begin cutting down his gabapentin as discussed in patient instructions. Check back in in 1 month. Call with any concerns.

## 2015-08-07 NOTE — Progress Notes (Signed)
BP 130/78 mmHg  Pulse 68  Temp(Src) 98.6 F (37 C)  Ht 5' 11.6" (1.819 m)  Wt 215 lb (97.523 kg)  BMI 29.47 kg/m2  SpO2 98%   Subjective:    Patient ID: Michael Bonilla, male    DOB: 18-Mar-1956, 60 y.o.   MRN: [id number]  HPI: Michael Bonilla is a 60 y.o. male  Chief Complaint  Patient presents with  . Leg Pain    Patient states that he would like to to try and stop some of the medications that he is taking for his leg pain   NEUROPATHY, still taking 75mg  BID Neuropathy status: better  Satisfied with current treatment?: no Medication side effects: no Medication compliance:  excellent compliance Location: R leg Pain: yes Severity: mild  Quality:  Stinging and hot Frequency: intermittent Bilateral: no Symmetric: no Numbness: yes Decreased sensation: no Weakness: no Context: better Alleviating factors: Lyrica Aggravating factors: nothing Treatments attempted: gabapentin, cymbalta, lyrica  Relevant past medical, surgical, family and social history reviewed and updated as indicated. Interim medical history since our last visit reviewed. Allergies and medications reviewed and updated.  Review of Systems  Constitutional: Negative.   Respiratory: Negative.   Cardiovascular: Negative.   Psychiatric/Behavioral: Negative.     Per HPI unless specifically indicated above     Objective:    BP 130/78 mmHg  Pulse 68  Temp(Src) 98.6 F (37 C)  Ht 5' 11.6" (1.819 m)  Wt 215 lb (97.523 kg)  BMI 29.47 kg/m2  SpO2 98%  Wt Readings from Last 3 Encounters:  08/07/15 215 lb (97.523 kg)  07/03/15 215 lb (97.523 kg)  01/12/15 210 lb (95.255 kg)    Physical Exam  Constitutional: He is oriented to person, place, and time. He appears well-developed and well-nourished. No distress.  HENT:  Head: Normocephalic and atraumatic.  Right Ear: Hearing normal.  Left Ear: Hearing normal.  Nose: Nose normal.  Eyes: Conjunctivae and lids are normal. Right eye exhibits no  discharge. Left eye exhibits no discharge. No scleral icterus.  Pulmonary/Chest: Effort normal. No respiratory distress.  Musculoskeletal: Normal range of motion.  Neurological: He is alert and oriented to person, place, and time.  Skin: Skin is warm, dry and intact. No rash noted. No erythema. No pallor.  Psychiatric: He has a normal mood and affect. His speech is normal and behavior is normal. Judgment and thought content normal. Cognition and memory are normal.  Nursing note and vitals reviewed.   Results for orders placed or performed in visit on 07/03/15  Rapid strep screen (not at [facility])  Result Value Ref Range   Strep Gp A Ag, IA W/Reflex Negative Negative  Culture, Group A Strep  Result Value Ref Range   Strep A Culture Negative       Assessment & Plan:   Problem List Items Addressed This Visit      Nervous and Auditory   Peripheral neuropathy ([place]) - Primary    Will increase his lyrica. Will begin cutting down his gabapentin as discussed in patient instructions. Check back in in 1 month. Call with any concerns.       Relevant Medications   pregabalin (LYRICA) 150 MG capsule       Follow up plan: Return in about 4 weeks (around 09/04/2015) for recheck neuropathy.

## 2015-08-18 ENCOUNTER — Other Ambulatory Visit: Payer: Self-pay | Admitting: Family Medicine

## 2015-09-06 ENCOUNTER — Encounter: Payer: Self-pay | Admitting: Family Medicine

## 2015-09-06 ENCOUNTER — Ambulatory Visit (INDEPENDENT_AMBULATORY_CARE_PROVIDER_SITE_OTHER): Payer: BLUE CROSS/BLUE SHIELD | Admitting: Family Medicine

## 2015-09-06 VITALS — BP 124/76 | HR 75 | Temp 98.3°F | Wt 216.0 lb

## 2015-09-06 DIAGNOSIS — I129 Hypertensive chronic kidney disease with stage 1 through stage 4 chronic kidney disease, or unspecified chronic kidney disease: Secondary | ICD-10-CM | POA: Diagnosis not present

## 2015-09-06 DIAGNOSIS — E785 Hyperlipidemia, unspecified: Secondary | ICD-10-CM

## 2015-09-06 DIAGNOSIS — G6289 Other specified polyneuropathies: Secondary | ICD-10-CM

## 2015-09-06 DIAGNOSIS — I10 Essential (primary) hypertension: Secondary | ICD-10-CM | POA: Diagnosis not present

## 2015-09-06 DIAGNOSIS — M25572 Pain in left ankle and joints of left foot: Secondary | ICD-10-CM

## 2015-09-06 MED ORDER — DULOXETINE HCL 20 MG PO CPEP
ORAL_CAPSULE | ORAL | Status: DC
Start: 2015-09-06 — End: 2016-02-27

## 2015-09-06 NOTE — Assessment & Plan Note (Signed)
Doing much better on lyrica. Continue to wean off gabapentin. OK to start weaning off cymbalta. Call if pain increases coming off these. Continue lyrica. Call with any concerns.

## 2015-09-06 NOTE — Assessment & Plan Note (Signed)
No complaints. Will recheck labs. Continue current regimen. Recheck at physical in 6 months.

## 2015-09-06 NOTE — Progress Notes (Signed)
BP 124/76 mmHg  Pulse 75  Temp(Src) 98.3 F (36.8 C)  Wt 216 lb (97.977 kg)  SpO2 96%   Subjective:    Patient ID: Michael Bonilla, male    DOB: 06/23/1955, 60 y.o.   MRN: [id number]  HPI: Michael Bonilla is a 60 y.o. male  Chief Complaint  Patient presents with  . Neuropathy    follow up, he states it's doing better and better. Left foot still bothers him some but not much.  . Other    he wants to know if he can stop the Cymblata   NEUROPATHY- weaning off the gabapentin. Wants to come off the cymbalta. Doing very well with the lyrica. Feeling much better Neuropathy status: controlled  Satisfied with current treatment?: yes Medication side effects: no Medication compliance:  excellent compliance Location: R leg and foot Pain: yes Severity: moderate  Quality:  Tingling and shooting Frequency: constant Bilateral: no Symmetric: no Numbness: yes Decreased sensation: yes Weakness: no Context: better Alleviating factors: lyrica Aggravating factors: nothing Treatments attempted: lyrica, gabapentin, cymabalta  FOOT PAIN Duration: about 6 weeks Involved foot: left Mechanism of injury: unknown Location: lateral foot and ankle Onset: gradual  Severity: moderate  Quality:  Feels like someone jumped on it or he rolled it Frequency: constant Radiation: no Aggravating factors: nothing  Alleviating factors: ice  Status: stable Treatments attempted: rest, ice and heat  Relief with NSAIDs?:  No NSAIDs Taken Weakness with weight bearing or walking: no Morning stiffness: no Swelling: no Redness: no Bruising: no Paresthesias / decreased sensation: no  Fevers:no  HYPERTENSION / HYPERLIPIDEMIA Satisfied with current treatment? yes Duration of hypertension: chronic BP monitoring frequency: not checking BP medication side effects: no Duration of hyperlipidemia: chronic Cholesterol medication side effects: no Cholesterol supplements: none Medication compliance:  excellent compliance Aspirin: yes Recent stressors: no Recurrent headaches: no Visual changes: no Palpitations: no Dyspnea: no Chest pain: no Lower extremity edema: no Dizzy/lightheaded: no  Relevant past medical, surgical, family and social history reviewed and updated as indicated. Interim medical history since our last visit reviewed. Allergies and medications reviewed and updated.  Review of Systems  Constitutional: Negative.   Respiratory: Negative.   Cardiovascular: Negative.   Musculoskeletal: Positive for joint swelling and arthralgias. Negative for myalgias, back pain, gait problem, neck pain and neck stiffness.  Skin: Negative.   Psychiatric/Behavioral: Negative.     Per HPI unless specifically indicated above     Objective:    BP 124/76 mmHg  Pulse 75  Temp(Src) 98.3 F (36.8 C)  Wt 216 lb (97.977 kg)  SpO2 96%  Wt Readings from Last 3 Encounters:  09/06/15 216 lb (97.977 kg)  08/07/15 215 lb (97.523 kg)  07/03/15 215 lb (97.523 kg)    Physical Exam  Constitutional: He is oriented to person, place, and time. He appears well-developed and well-nourished. No distress.  HENT:  Head: Normocephalic and atraumatic.  Right Ear: Hearing normal.  Left Ear: Hearing normal.  Nose: Nose normal.  Eyes: Conjunctivae and lids are normal. Right eye exhibits no discharge. Left eye exhibits no discharge. No scleral icterus.  Cardiovascular: Normal rate, regular rhythm, normal heart sounds and intact distal pulses.  Exam reveals no gallop and no friction rub.   No murmur heard. Pulmonary/Chest: Effort normal and breath sounds normal. No respiratory distress. He has no wheezes. He has no rales. He exhibits no tenderness.  Neurological: He is alert and oriented to person, place, and time.  Skin: Skin is warm, dry and  intact. No rash noted. No erythema. No pallor.  Psychiatric: He has a normal mood and affect. His speech is normal and behavior is normal. Judgment and thought  content normal. Cognition and memory are normal.  Nursing note and vitals reviewed.  Ankle Exam: Left    Inspection: Ankle inspection normal     Tenderness to Palpation:       Proximal fibula: no    Distal fibula: no    Medial malleolus: no    Lateral malleolus: yes    Base of 5th metatarsal: yes     Anterior talofibular ligament: no    Calcaneofibular ligament: no    Posterior talofibular ligament: no    Deltoid ligament: yes    Syndesmosis: no     Range of Motion: Full ROM    Strength:  5/5 bilaterally     Special Tests:    Anterior drawer: negative    Talar tilt: negative    Syndesmosis squeeze test: negative     Gait:  mild antalgic       Assessment & Plan:   Problem List Items Addressed This Visit      Cardiovascular and Mediastinum   RESOLVED: Essential hypertension     Nervous and Auditory   Peripheral neuropathy ([place]) - Primary    Doing much better on lyrica. Continue to wean off gabapentin. OK to start weaning off cymbalta. Call if pain increases coming off these. Continue lyrica. Call with any concerns.       Relevant Medications   DULoxetine (CYMBALTA) 20 MG capsule     Genitourinary   Hypertensive CKD (chronic kidney disease)    Under good control. Continue current regimen. Rechecking labs. Continue to monitor. Return for physical in 6 months.       Relevant Orders   Comprehensive metabolic panel     Other   Hyperlipidemia    No complaints. Will recheck labs. Continue current regimen. Recheck at physical in 6 months.       Relevant Orders   Comprehensive metabolic panel   Lipid Panel w/o Chol/HDL Ratio    Other Visit Diagnoses    Left lateral ankle pain        Exercises given. Will obtain x-ray. Call if not getting better or getting worse.     Relevant Orders    DG Ankle Complete Left        Follow up plan: Return in about 6 months (around 03/08/2016) for Physical.

## 2015-09-06 NOTE — Assessment & Plan Note (Signed)
Under good control. Continue current regimen. Rechecking labs. Continue to monitor. Return for physical in 6 months.

## 2015-09-06 NOTE — Patient Instructions (Signed)
Generic Ankle Exercises EXERCISES RANGE OF MOTION (ROM) AND STRETCHING EXERCISES These exercises may help you when beginning to rehabilitate your injury. Your symptoms may resolve with or without further involvement from your physician, physical therapist or athletic trainer. While completing these exercises, remember:   Restoring tissue flexibility helps normal motion to return to the joints. This allows healthier, less painful movement and activity.  An effective stretch should be held for at least 30 seconds.  A stretch should never be painful. You should only feel a gentle lengthening or release in the stretched tissue. RANGE OF MOTION - Dorsi/Plantar Flexion  While sitting with your right / left knee straight, draw the top of your foot upwards by flexing your ankle. Then reverse the motion, pointing your toes downward.  Hold each position for __________ seconds.  After completing your first set of exercises, repeat this exercise with your knee bent. Repeat __________ times. Complete this exercise __________ times per day.  RANGE OF MOTION - Ankle Alphabet  Imagine your right / left big toe is a pen.  Keeping your hip and knee still, write out the entire alphabet with your "pen." Make the letters as large as you can without increasing any discomfort. Repeat __________ times. Complete this exercise __________ times per day.  RANGE OF MOTION - Ankle Dorsiflexion, Active Assisted   Remove shoes and sit on a chair that is preferably not on a carpeted surface.  Place right / left foot under knee. Extend your opposite leg for support.  Keeping your heel down, slide your right / left foot back toward the chair until you feel a stretch at your ankle or calf. If you do not feel a stretch, slide your bottom forward to the edge of the chair while still keeping your heel down.  Hold this stretch for __________ seconds. Repeat __________ times. Complete this stretch __________ times per day.    STRENGTHENING EXERCISES  These exercises may help you when beginning to rehabilitate your injury. They may resolve your symptoms with or without further involvement from your physician, physical therapist or athletic trainer. While completing these exercises, remember:   Muscles can gain both the endurance and the strength needed for everyday activities through controlled exercises.  Complete these exercises as instructed by your physician, physical therapist or athletic trainer. Progress the resistance and repetitions only as guided.  You may experience muscle soreness or fatigue, but the pain or discomfort you are trying to eliminate should never worsen during these exercises. If this pain does worsen, stop and make certain you are following the directions exactly. If the pain is still present after adjustments, discontinue the exercise until you can discuss the trouble with your clinician. STRENGTH - Dorsiflexors  Secure a rubber exercise band/tubing to a fixed object (table, pole) and loop the other end around your right / left foot.  Sit on the floor facing the fixed object. The band/tubing should be slightly tense when your foot is relaxed.  Slowly draw your foot back toward you using your ankle and toes.  Hold this position for __________ seconds. Slowly release the tension in the band and return your foot to the starting position. Repeat __________ times. Complete this exercise __________ times per day.  STRENGTH - Plantar-flexors  Sit with your right / left leg extended. Holding onto both ends of a rubber exercise band/tubing, loop it around the ball of your foot. Keep a slight tension in the band.  Slowly push your toes away from you, pointing   them downward.  Hold this position for __________ seconds. Return slowly, controlling the tension in the band/tubing. Repeat __________ times. Complete this exercise __________ times per day.  STRENGTH - Ankle Eversion  Secure one end of  a rubber exercise band/tubing to a fixed object (table, pole). Loop the other end around your foot just before your toes.  Place your fists between your knees. This will focus your strengthening at your ankle.  Drawing the band/tubing across your opposite foot, slowly, pull your little toe out and up. Make sure the band/tubing is positioned to resist the entire motion.  Hold this position for __________ seconds.  Have your muscles resist the band/tubing as it slowly pulls your foot back to the starting position. Repeat __________ times. Complete this exercise __________ times per day.  STRENGTH - Ankle Inversion  Secure one end of a rubber exercise band/tubing to a fixed object (table, pole). Loop the other end around your foot just before your toes.  Place your fists between your knees. This will focus your strengthening at your ankle.  Slowly, pull your big toe up and in, making sure the band/tubing is positioned to resist the entire motion.  Hold this position for __________ seconds.  Have your muscles resist the band/tubing as it slowly pulls your foot back to the starting position. Repeat __________ times. Complete this exercises __________ times per day.  STRENGTH - Towel Curls  Sit in a chair positioned on a non-carpeted surface.  Place your foot on a towel, keeping your heel on the floor.  Pull the towel toward your heel by only curling your toes. Keep your heel on the floor. If instructed by your physician, physical therapist or athletic trainer, add weight to the end of the towel. Repeat __________ times. Complete this exercise __________ times per day. STRENGTH - Plantar-flexors, Standing  Stand with your feet shoulder width apart. Steady yourself with a wall or table using as little support as needed.  Keeping your weight evenly spread over the width of your feet, rise up on your toes.*  Hold this position for __________ seconds. Repeat __________ times. Complete  this exercise __________ times per day.  *If this is too easy, shift your weight toward your right / left leg until you feel challenged. Ultimately, you may be asked to do this exercise with your right / left foot only. BALANCE - Tandem Walking  Place your uninjured foot on a line 2-4 inches wide and at least 10 feet long.  Keeping your balance without using anything for extra support, place your right / left heel directly in front of your other foot.  Slowly raise your back foot up, lifting from the heel to the toes, and place it directly in front of the right / left foot.  Continue to walk along the line slowly. Walk for ____________________ feet. Repeat ____________________ times. Complete ____________________ times per day.   This information is not intended to replace advice given to you by your health care provider. Make sure you discuss any questions you have with your health care provider.   Document Released: 02/19/2005 Document Revised: 04/28/2014 Document Reviewed: 07/20/2008 Elsevier Interactive Patient Education 2016 [employer] Generic Ankle Exercises EXERCISES RANGE OF MOTION (ROM) AND STRETCHING EXERCISES These exercises may help you when beginning to rehabilitate your injury. Your symptoms may resolve with or without further involvement from your physician, physical therapist or athletic trainer. While completing these exercises, remember:   Restoring tissue flexibility helps normal motion to return to  the joints. This allows healthier, less painful movement and activity.  An effective stretch should be held for at least 30 seconds.  A stretch should never be painful. You should only feel a gentle lengthening or release in the stretched tissue. RANGE OF MOTION - Dorsi/Plantar Flexion  While sitting with your right / left knee straight, draw the top of your foot upwards by flexing your ankle. Then reverse the motion, pointing your toes downward.  Hold each position  for __________ seconds.  After completing your first set of exercises, repeat this exercise with your knee bent. Repeat __________ times. Complete this exercise __________ times per day.  RANGE OF MOTION - Ankle Alphabet  Imagine your right / left big toe is a pen.  Keeping your hip and knee still, write out the entire alphabet with your "pen." Make the letters as large as you can without increasing any discomfort. Repeat __________ times. Complete this exercise __________ times per day.  RANGE OF MOTION - Ankle Dorsiflexion, Active Assisted   Remove shoes and sit on a chair that is preferably not on a carpeted surface.  Place right / left foot under knee. Extend your opposite leg for support.  Keeping your heel down, slide your right / left foot back toward the chair until you feel a stretch at your ankle or calf. If you do not feel a stretch, slide your bottom forward to the edge of the chair while still keeping your heel down.  Hold this stretch for __________ seconds. Repeat __________ times. Complete this stretch __________ times per day.  STRENGTHENING EXERCISES  These exercises may help you when beginning to rehabilitate your injury. They may resolve your symptoms with or without further involvement from your physician, physical therapist or athletic trainer. While completing these exercises, remember:   Muscles can gain both the endurance and the strength needed for everyday activities through controlled exercises.  Complete these exercises as instructed by your physician, physical therapist or athletic trainer. Progress the resistance and repetitions only as guided.  You may experience muscle soreness or fatigue, but the pain or discomfort you are trying to eliminate should never worsen during these exercises. If this pain does worsen, stop and make certain you are following the directions exactly. If the pain is still present after adjustments, discontinue the exercise until you  can discuss the trouble with your clinician. STRENGTH - Dorsiflexors  Secure a rubber exercise band/tubing to a fixed object (table, pole) and loop the other end around your right / left foot.  Sit on the floor facing the fixed object. The band/tubing should be slightly tense when your foot is relaxed.  Slowly draw your foot back toward you using your ankle and toes.  Hold this position for __________ seconds. Slowly release the tension in the band and return your foot to the starting position. Repeat __________ times. Complete this exercise __________ times per day.  STRENGTH - Plantar-flexors  Sit with your right / left leg extended. Holding onto both ends of a rubber exercise band/tubing, loop it around the ball of your foot. Keep a slight tension in the band.  Slowly push your toes away from you, pointing them downward.  Hold this position for __________ seconds. Return slowly, controlling the tension in the band/tubing. Repeat __________ times. Complete this exercise __________ times per day.  STRENGTH - Ankle Eversion  Secure one end of a rubber exercise band/tubing to a fixed object (table, pole). Loop the other end around your foot just  before your toes.  Place your fists between your knees. This will focus your strengthening at your ankle.  Drawing the band/tubing across your opposite foot, slowly, pull your little toe out and up. Make sure the band/tubing is positioned to resist the entire motion.  Hold this position for __________ seconds.  Have your muscles resist the band/tubing as it slowly pulls your foot back to the starting position. Repeat __________ times. Complete this exercise __________ times per day.  STRENGTH - Ankle Inversion  Secure one end of a rubber exercise band/tubing to a fixed object (table, pole). Loop the other end around your foot just before your toes.  Place your fists between your knees. This will focus your strengthening at your  ankle.  Slowly, pull your big toe up and in, making sure the band/tubing is positioned to resist the entire motion.  Hold this position for __________ seconds.  Have your muscles resist the band/tubing as it slowly pulls your foot back to the starting position. Repeat __________ times. Complete this exercises __________ times per day.  STRENGTH - Towel Curls  Sit in a chair positioned on a non-carpeted surface.  Place your foot on a towel, keeping your heel on the floor.  Pull the towel toward your heel by only curling your toes. Keep your heel on the floor. If instructed by your physician, physical therapist or athletic trainer, add weight to the end of the towel. Repeat __________ times. Complete this exercise __________ times per day. STRENGTH - Plantar-flexors, Standing  Stand with your feet shoulder width apart. Steady yourself with a wall or table using as little support as needed.  Keeping your weight evenly spread over the width of your feet, rise up on your toes.*  Hold this position for __________ seconds. Repeat __________ times. Complete this exercise __________ times per day.  *If this is too easy, shift your weight toward your right / left leg until you feel challenged. Ultimately, you may be asked to do this exercise with your right / left foot only. BALANCE - Tandem Walking  Place your uninjured foot on a line 2-4 inches wide and at least 10 feet long.  Keeping your balance without using anything for extra support, place your right / left heel directly in front of your other foot.  Slowly raise your back foot up, lifting from the heel to the toes, and place it directly in front of the right / left foot.  Continue to walk along the line slowly. Walk for ____________________ feet. Repeat ____________________ times. Complete ____________________ times per day.   This information is not intended to replace advice given to you by your health care provider. Make sure  you discuss any questions you have with your health care provider.   Document Released: 02/19/2005 Document Revised: 04/28/2014 Document Reviewed: 07/20/2008 Elsevier Interactive Patient Education [employer].

## 2015-09-26 ENCOUNTER — Other Ambulatory Visit: Payer: Self-pay | Admitting: Family Medicine

## 2015-10-02 ENCOUNTER — Other Ambulatory Visit: Payer: Self-pay | Admitting: Family Medicine

## 2015-10-02 NOTE — Telephone Encounter (Signed)
Ok to call in

## 2015-10-11 ENCOUNTER — Other Ambulatory Visit: Payer: Self-pay | Admitting: Family Medicine

## 2015-10-11 NOTE — Telephone Encounter (Signed)
Called patient, no answer, left a message for patient to return my call.  

## 2015-10-11 NOTE — Telephone Encounter (Signed)
He's supposed to be coming off his cymbalta. Can we find out what's going on with him? Thanks!

## 2015-10-12 NOTE — Telephone Encounter (Signed)
Called patients home, no HIPPA, so unable to speak with patients wife, asked wife to have him call, she states that she will tell him, but he probably will not do it.

## 2015-10-15 NOTE — Telephone Encounter (Signed)
Dr. Wynetta Emery, what would you like me to do about this patient, since I have not been able to get in contact with him?

## 2015-12-06 ENCOUNTER — Other Ambulatory Visit: Payer: Self-pay | Admitting: Family Medicine

## 2015-12-13 ENCOUNTER — Ambulatory Visit: Payer: BLUE CROSS/BLUE SHIELD | Admitting: Family Medicine

## 2015-12-17 ENCOUNTER — Other Ambulatory Visit: Payer: Self-pay | Admitting: Family Medicine

## 2016-01-01 ENCOUNTER — Ambulatory Visit (INDEPENDENT_AMBULATORY_CARE_PROVIDER_SITE_OTHER): Payer: BLUE CROSS/BLUE SHIELD | Admitting: Family Medicine

## 2016-01-01 ENCOUNTER — Encounter: Payer: Self-pay | Admitting: Family Medicine

## 2016-01-01 DIAGNOSIS — B372 Candidiasis of skin and nail: Secondary | ICD-10-CM

## 2016-01-01 DIAGNOSIS — N5089 Other specified disorders of the male genital organs: Secondary | ICD-10-CM

## 2016-01-01 DIAGNOSIS — G6289 Other specified polyneuropathies: Secondary | ICD-10-CM | POA: Diagnosis not present

## 2016-01-01 DIAGNOSIS — Z23 Encounter for immunization: Secondary | ICD-10-CM | POA: Diagnosis not present

## 2016-01-01 MED ORDER — NYSTATIN 100000 UNIT/GM EX POWD: Freq: Four times a day (QID) | CUTANEOUS | 0 refills | Status: DC

## 2016-01-01 NOTE — Progress Notes (Signed)
There were no vitals taken for this visit.   Subjective:    Patient ID: Michael Bonilla, male    DOB: December 01, 1955, 60 y.o.   MRN: [id number]  HPI: Michael Bonilla is a 60 y.o. male  Chief Complaint  Patient presents with  . Medication Management  . Leg Pain    Right   His wife feels like he has been really irritable with her and that his memory has not been as good  NEUROPATHY Neuropathy status: controlled  Satisfied with current treatment?: yes Medication side effects: no Medication compliance:  excellent compliance Location: R leg Pain: a little bit Severity: mild  Quality:  Tingling and shooting Frequency: constant Bilateral: no Symmetric: no Numbness: yes Decreased sensation: yes Weakness: yes Context: better Alleviating factors: lyrica Aggravating factors: a lot of work Treatments attempted: gabapentin, cymabalta  Testicles have been swelling at the end of the day. No pain.   Relevant past medical, surgical, family and social history reviewed and updated as indicated. Interim medical history since our last visit reviewed. Allergies and medications reviewed and updated.  Review of Systems  Constitutional: Negative.   Respiratory: Negative.   Cardiovascular: Negative.   Psychiatric/Behavioral: Negative.     Per HPI unless specifically indicated above     Objective:    There were no vitals taken for this visit.  Wt Readings from Last 3 Encounters:  09/06/15 216 lb (98 kg)  08/07/15 215 lb (97.5 kg)  07/03/15 215 lb (97.5 kg)    Physical Exam  Constitutional: He is oriented to person, place, and time. He appears well-developed and well-nourished. No distress.  HENT:  Head: Normocephalic and atraumatic.  Right Ear: Hearing normal.  Left Ear: Hearing normal.  Nose: Nose normal.  Eyes: Conjunctivae and lids are normal. Right eye exhibits no discharge. Left eye exhibits no discharge. No scleral icterus.  Cardiovascular: Normal rate, regular rhythm,  normal heart sounds and intact distal pulses.  Exam reveals no gallop and no friction rub.   No murmur heard. Pulmonary/Chest: Effort normal and breath sounds normal. No respiratory distress. He has no wheezes. He has no rales. He exhibits no tenderness.  Abdominal: Hernia confirmed negative in the right inguinal area and confirmed negative in the left inguinal area.  Genitourinary: Testes normal and penis normal. Right testis shows no mass, no swelling and no tenderness. Right testis is descended. Cremasteric reflex is not absent on the right side. Left testis shows no mass, no swelling and no tenderness. Left testis is descended. Cremasteric reflex is not absent on the left side. No penile tenderness.  Musculoskeletal: Normal range of motion.  Neurological: He is alert and oriented to person, place, and time.  Skin: Skin is warm, dry and intact. No rash noted. No erythema. No pallor.  Psychiatric: He has a normal mood and affect. His speech is normal and behavior is normal. Judgment and thought content normal. Cognition and memory are normal.  Nursing note and vitals reviewed.   Results for orders placed or performed in visit on 07/03/15  Rapid strep screen (not at [facility])  Result Value Ref Range   Strep Gp A Ag, IA W/Reflex Negative Negative  Culture, Group A Strep  Result Value Ref Range   Strep A Culture Negative       Assessment & Plan:   Problem List Items Addressed This Visit      Nervous and Auditory   Peripheral neuropathy ([place]) - Primary    Will cut down to  2 of his lyrica a day and check in by phone in 2 weeks.        Other Visit Diagnoses    Need for influenza vaccination       Flu shot given today.   Relevant Orders   Flu Vaccine QUAD 36+ mos PF IM (Fluarix & Fluzone Quad PF) (Completed)   Need for pneumococcal vaccination       Pneumonia shot given today.   Relevant Orders   Pneumococcal conjugate vaccine 13-valent (Completed)   Scrotal edema       Likely due to  lyrica. Testicular exam normal. Call with concerns.    Candidal intertrigo       Will treat with nystatin. Rx sent to her pharmacy.   Relevant Medications   nystatin (MYCOSTATIN/NYSTOP) powder       Follow up plan: Return As scheduled.

## 2016-01-01 NOTE — Assessment & Plan Note (Signed)
Will cut down to 2 of his lyrica a day and check in by phone in 2 weeks.

## 2016-01-01 NOTE — Patient Instructions (Addendum)
Influenza (Flu) Vaccine (Inactivated or Recombinant):  1. Why get vaccinated? Influenza ("flu") is a contagious disease that spreads around the [place] every year, usually between October and May. Flu is caused by influenza viruses, and is spread mainly by coughing, sneezing, and close contact. Anyone can get flu. Flu strikes suddenly and can last several days. Symptoms vary by age, but can include:  fever/chills  sore throat  muscle aches  fatigue  cough  headache  runny or stuffy nose Flu can also lead to pneumonia and blood infections, and cause diarrhea and seizures in children. If you have a medical condition, such as heart or lung disease, flu can make it worse. Flu is more dangerous for some people. Infants and young children, people 65 years of age and older, pregnant women, and people with certain health conditions or a weakened immune system are at greatest risk. Each year thousands of people in the [place] die from flu, and many more are hospitalized. Flu vaccine can:  keep you from getting flu,  make flu less severe if you do get it, and  keep you from spreading flu to your family and other people. 2. Inactivated and recombinant flu vaccines A dose of flu vaccine is recommended every flu season. Children 6 months through 8 years of age may need two doses during the same flu season. Everyone else needs only one dose each flu season. Some inactivated flu vaccines contain a very small amount of a mercury-based preservative called thimerosal. Studies have not shown thimerosal in vaccines to be harmful, but flu vaccines that do not contain thimerosal are available. There is no live flu virus in flu shots. They cannot cause the flu. There are many flu viruses, and they are always changing. Each year a new flu vaccine is made to protect against three or four viruses that are likely to cause disease in the upcoming flu season. But even when the vaccine doesn't exactly  match these viruses, it may still provide some protection. Flu vaccine cannot prevent:  flu that is caused by a virus not covered by the vaccine, or  illnesses that look like flu but are not. It takes about 2 weeks for protection to develop after vaccination, and protection lasts through the flu season. 3. Some people should not get this vaccine Tell the person who is giving you the vaccine:  If you have any severe, life-threatening allergies. If you ever had a life-threatening allergic reaction after a dose of flu vaccine, or have a severe allergy to any part of this vaccine, you may be advised not to get vaccinated. Most, but not all, types of flu vaccine contain a small amount of egg protein.  If you ever had Guillain-Barre Syndrome (also called GBS). Some people with a history of GBS should not get this vaccine. This should be discussed with your doctor.  If you are not feeling well. It is usually okay to get flu vaccine when you have a mild illness, but you might be asked to come back when you feel better. 4. Risks of a vaccine reaction With any medicine, including vaccines, there is a chance of reactions. These are usually mild and go away on their own, but serious reactions are also possible. Most people who get a flu shot do not have any problems with it. Minor problems following a flu shot include:  soreness, redness, or swelling where the shot was given  hoarseness  sore, red or itchy eyes  cough    fever  aches  headache  itching  fatigue If these problems occur, they usually begin soon after the shot and last 1 or 2 days. More serious problems following a flu shot can include the following:  There may be a small increased risk of Guillain-Barre Syndrome (GBS) after inactivated flu vaccine. This risk has been estimated at 1 or 2 additional cases per million people vaccinated. This is much lower than the risk of severe complications from flu, which can be prevented by  flu vaccine.  Young children who get the flu shot along with pneumococcal vaccine (PCV13) and/or DTaP vaccine at the same time might be slightly more likely to have a seizure caused by fever. Ask your doctor for more information. Tell your doctor if a child who is getting flu vaccine has ever had a seizure. Problems that could happen after any injected vaccine:  People sometimes faint after a medical procedure, including vaccination. Sitting or lying down for about 15 minutes can help prevent fainting, and injuries caused by a fall. Tell your doctor if you feel dizzy, or have vision changes or ringing in the ears.  Some people get severe pain in the shoulder and have difficulty moving the arm where a shot was given. This happens very rarely.  Any medication can cause a severe allergic reaction. Such reactions from a vaccine are very rare, estimated at about 1 in a million doses, and would happen within a few minutes to a few hours after the vaccination. As with any medicine, there is a very remote chance of a vaccine causing a serious injury or death. The safety of vaccines is always being monitored. For more information, visit: www.cdc.gov/vaccinesafety/ 5. What if there is a serious reaction? What should I look for?  Look for anything that concerns you, such as signs of a severe allergic reaction, very high fever, or unusual behavior. Signs of a severe allergic reaction can include hives, swelling of the face and throat, difficulty breathing, a fast heartbeat, dizziness, and weakness. These would start a few minutes to a few hours after the vaccination. What should I do?  If you think it is a severe allergic reaction or other emergency that can't wait, call 9-1-1 and get the person to the nearest hospital. Otherwise, call your doctor.  Reactions should be reported to the Vaccine Adverse Event Reporting System (VAERS). Your doctor should file this report, or you can do it yourself through the  VAERS web site at www.vaers.hhs.gov, or by calling 1-800-822-7967. VAERS does not give medical advice. 6. The National Vaccine Injury Compensation Program The National Vaccine Injury Compensation Program (VICP) is a federal program that was created to compensate people who may have been injured by certain vaccines. Persons who believe they may have been injured by a vaccine can learn about the program and about filing a claim by calling 1-800-338-2382 or visiting the VICP website at www.hrsa.gov/vaccinecompensation. There is a time limit to file a claim for compensation. 7. How can I learn more?  Ask your healthcare provider. He or she can give you the vaccine package insert or suggest other sources of information.  Call your local or state health department.  Contact the Centers for Disease Control and Prevention (CDC):  Call 1-800-232-4636 (1-800-CDC-INFO) or  Visit CDC's website at www.cdc.gov/flu Vaccine Information Statement Inactivated Influenza Vaccine (11/25/2013)   This information is not intended to replace advice given to you by your health care provider. Make sure you discuss any questions you have with   your health care provider.   Document Released: 01/30/2006 Document Revised: 04/28/2014 Document Reviewed: 11/28/2013 Elsevier Interactive Patient Education 2016 Else[employer]. Pneumococcal Polysaccharide Vaccine: What You Need to Know 1. Why get vaccinated? Vaccination can protect older adults (and some children and younger adults) from pneumococcal disease. Pneumococcal disease is caused by bacteria that can spread from person to person through close contact. It can cause ear infections, and it can also lead to more serious infections of the:   Lungs (pneumonia),  Blood (bacteremia), and  Covering of the brain and spinal cord (meningitis). Meningitis can cause deafness and brain damage, and it can be fatal. Anyone can get pneumococcal disease, but children under 2 years  of age, people with certain medical conditions, adults over 40 years of age, and cigarette smokers are at the highest risk. About 18,000 older adults die each year from pneumococcal disease in the [place]. Treatment of pneumococcal infections with penicillin and other drugs used to be more effective. But some strains of the disease have become resistant to these drugs. This makes prevention of the disease, through vaccination, even more important. 2. Pneumococcal polysaccharide vaccine (PPSV23) Pneumococcal polysaccharide vaccine (PPSV23) protects against 23 types of pneumococcal bacteria. It will not prevent all pneumococcal disease. PPSV23 is recommended for:  All adults 47 years of age and older,  Anyone 2 through 62 years of age with certain long-term health problems,  Anyone 2 through 62 years of age with a weakened immune system,  Adults 74 through 62 years of age who smoke cigarettes or have asthma. Most people need only one dose of PPSV. A second dose is recommended for certain high-risk groups. People 68 and older should get a dose even if they have gotten one or more doses of the vaccine before they turned 65. Your healthcare provider can give you more information about these recommendations. Most healthy adults develop protection within 2 to 3 weeks of getting the shot. 3. Some people should not get this vaccine  Anyone who has had a life-threatening allergic reaction to PPSV should not get another dose.  Anyone who has a severe allergy to any component of PPSV should not receive it. Tell your provider if you have any severe allergies.  Anyone who is moderately or severely ill when the shot is scheduled may be asked to wait until they recover before getting the vaccine. Someone with a mild illness can usually be vaccinated.  Children less than 19 years of age should not receive this vaccine.  There is no evidence that PPSV is harmful to either a pregnant woman or to her  fetus. However, as a precaution, women who need the vaccine should be vaccinated before becoming pregnant, if possible. 4. Risks of a vaccine reaction With any medicine, including vaccines, there is a chance of side effects. These are usually mild and go away on their own, but serious reactions are also possible. About half of people who get PPSV have mild side effects, such as redness or pain where the shot is given, which go away within about two days. Less than 1 out of 100 people develop a fever, muscle aches, or more severe local reactions. Problems that could happen after any vaccine:  People sometimes faint after a medical procedure, including vaccination. Sitting or lying down for about 15 minutes can help prevent fainting, and injuries caused by a fall. Tell your doctor if you feel dizzy, or have vision changes or ringing in the ears.  Some people get  severe pain in the shoulder and have difficulty moving the arm where a shot was given. This happens very rarely.  Any medication can cause a severe allergic reaction. Such reactions from a vaccine are very rare, estimated at about 1 in a million doses, and would happen within a few minutes to a few hours after the vaccination. As with any medicine, there is a very remote chance of a vaccine causing a serious injury or death. The safety of vaccines is always being monitored. For more information, visit: http://www.aguilar.org/ 5. What if there is a serious reaction? What should I look for? Look for anything that concerns you, such as signs of a severe allergic reaction, very high fever, or unusual behavior.  Signs of a severe allergic reaction can include hives, swelling of the face and throat, difficulty breathing, a fast heartbeat, dizziness, and weakness. These would usually start a few minutes to a few hours after the vaccination. What should I do? If you think it is a severe allergic reaction or other emergency that can't wait, call  9-1-1 or get to the nearest hospital. Otherwise, call your doctor. Afterward, the reaction should be reported to the Vaccine Adverse Event Reporting System (VAERS). Your doctor might file this report, or you can do it yourself through the VAERS web site at www.vaers.SamedayNews.es, or by calling 516-088-7525.  VAERS does not give medical advice. 6. How can I learn more?  Ask your doctor. He or she can give you the vaccine package insert or suggest other sources of information.  Call your local or state health department.  Contact the Centers for Disease Control and Prevention (CDC):  Call 504-148-6202 (1-800-CDC-INFO) or  Visit CDC's website at http://hunter.com/ CDC Pneumococcal Polysaccharide Vaccine VIS (08/12/13)   This information is not intended to replace advice given to you by your health care provider. Make sure you discuss any questions you have with your health care provider.   Document Released: 02/02/2006 Document Revised: 04/28/2014 Document Reviewed: 08/15/2013 Elsevier Interactive Patient Education [employer].

## 2016-01-15 ENCOUNTER — Telehealth: Payer: Self-pay | Admitting: Family Medicine

## 2016-01-15 NOTE — Telephone Encounter (Signed)
Mood doing OK. Swelling has decreased. Will continue on 1 lyrica daily.

## 2016-01-15 NOTE — Telephone Encounter (Signed)
-----   Message from Valerie Roys, DO sent at 01/01/2016  4:19 PM EDT ----- Call and check in on mood and swelling

## 2016-02-07 ENCOUNTER — Other Ambulatory Visit: Payer: Self-pay | Admitting: Family Medicine

## 2016-02-27 ENCOUNTER — Ambulatory Visit (INDEPENDENT_AMBULATORY_CARE_PROVIDER_SITE_OTHER): Payer: BLUE CROSS/BLUE SHIELD | Admitting: Family Medicine

## 2016-02-27 ENCOUNTER — Encounter: Payer: Self-pay | Admitting: Family Medicine

## 2016-02-27 VITALS — BP 134/78 | HR 71 | Temp 97.7°F | Wt 218.0 lb

## 2016-02-27 DIAGNOSIS — M722 Plantar fascial fibromatosis: Secondary | ICD-10-CM | POA: Diagnosis not present

## 2016-02-27 MED ORDER — PREDNISONE 20 MG PO TABS: 40.0000 mg | ORAL_TABLET | Freq: Every day | ORAL | 0 refills | Status: DC

## 2016-02-27 NOTE — Progress Notes (Signed)
   BP 134/78   Pulse 71   Temp 97.7 F (36.5 C)   Wt 218 lb (98.9 kg)   SpO2 98%   BMI 29.90 kg/m    Subjective:    Patient ID: Michael Bonilla, male    DOB: Apr 05, 1956, 60 y.o.   MRN: [id number]  HPI: Michael Bonilla is a 60 y.o. male  Chief Complaint  Patient presents with  . Foot Pain    x 10 day, left foot, the bottom of his heel and instep hurts, throbbing and shoots up into his leg. Feels like it's bruised, slight swelling. No known injury.    10 day history of left heel pain extending to the ball of his foot. States it is a throbbing, pulling sensation that is severely painful and difficult to bear weight on. No known injury. Works on his feet all day. Wears older, worn shoes. Has been resting and elevating foot, taking ibuprofen with minimal relief. Denies fever, chills, wounds. Does have hx of peripheral neuropathy, but is decently controlled with lyrica and mostly only has sxs on right foot. States this feels different.   Relevant past medical, surgical, family and social history reviewed and updated as indicated. Interim medical history since our last visit reviewed. Allergies and medications reviewed and updated.  Review of Systems  Constitutional: Negative.   HENT: Negative.   Respiratory: Negative.   Cardiovascular: Negative.   Gastrointestinal: Negative.   Genitourinary: Negative.   Musculoskeletal: Positive for arthralgias.  Skin: Negative.   Neurological: Negative.   Psychiatric/Behavioral: Negative.     Per HPI unless specifically indicated above     Objective:    BP 134/78   Pulse 71   Temp 97.7 F (36.5 C)   Wt 218 lb (98.9 kg)   SpO2 98%   BMI 29.90 kg/m   Wt Readings from Last 3 Encounters:  02/27/16 218 lb (98.9 kg)  09/06/15 216 lb (98 kg)  08/07/15 215 lb (97.5 kg)    Physical Exam  Constitutional: He is oriented to person, place, and time. He appears well-developed and well-nourished. No distress.  HENT:  Head: Atraumatic.    Eyes: Conjunctivae are normal. Pupils are equal, round, and reactive to light. No scleral icterus.  Neck: Normal range of motion. Neck supple.  Cardiovascular: Normal rate and intact distal pulses.   Pulmonary/Chest: Effort normal. No respiratory distress.  Musculoskeletal:  Left heel moderately tender to palpation ROM painful but intact  Lymphadenopathy:    He has no cervical adenopathy.  Neurological: He is alert and oriented to person, place, and time.  Left LE neurovascularly intact  Skin: Skin is warm and dry.  Psychiatric: He has a normal mood and affect. His behavior is normal.      Assessment & Plan:   Problem List Items Addressed This Visit    None    Visit Diagnoses    Plantar fasciitis of left foot    -  Primary   Prednisone sent, recommended heel brace specifically for the condition. Tylenol for pain relief. Epsom salt soaks, gentle stretches, roll foot on tennis ball       Follow up plan: Return if symptoms worsen or fail to improve.

## 2016-02-27 NOTE — Patient Instructions (Signed)
Follow up as needed

## 2016-03-07 ENCOUNTER — Ambulatory Visit: Payer: BLUE CROSS/BLUE SHIELD | Admitting: Family Medicine

## 2016-03-20 ENCOUNTER — Ambulatory Visit: Payer: BLUE CROSS/BLUE SHIELD | Admitting: Family Medicine

## 2016-03-27 ENCOUNTER — Ambulatory Visit (INDEPENDENT_AMBULATORY_CARE_PROVIDER_SITE_OTHER): Payer: BLUE CROSS/BLUE SHIELD | Admitting: Family Medicine

## 2016-03-27 ENCOUNTER — Other Ambulatory Visit: Payer: Self-pay | Admitting: Family Medicine

## 2016-03-27 ENCOUNTER — Encounter: Payer: Self-pay | Admitting: Family Medicine

## 2016-03-27 VITALS — BP 134/81 | HR 67 | Temp 98.1°F | Wt 218.6 lb

## 2016-03-27 DIAGNOSIS — D485 Neoplasm of uncertain behavior of skin: Secondary | ICD-10-CM | POA: Diagnosis not present

## 2016-03-27 DIAGNOSIS — K219 Gastro-esophageal reflux disease without esophagitis: Secondary | ICD-10-CM

## 2016-03-27 DIAGNOSIS — G6289 Other specified polyneuropathies: Secondary | ICD-10-CM | POA: Diagnosis not present

## 2016-03-27 DIAGNOSIS — I129 Hypertensive chronic kidney disease with stage 1 through stage 4 chronic kidney disease, or unspecified chronic kidney disease: Secondary | ICD-10-CM | POA: Diagnosis not present

## 2016-03-27 DIAGNOSIS — E782 Mixed hyperlipidemia: Secondary | ICD-10-CM

## 2016-03-27 DIAGNOSIS — N181 Chronic kidney disease, stage 1: Secondary | ICD-10-CM | POA: Diagnosis not present

## 2016-03-27 DIAGNOSIS — Z720 Tobacco use: Secondary | ICD-10-CM | POA: Diagnosis not present

## 2016-03-27 DIAGNOSIS — M722 Plantar fascial fibromatosis: Secondary | ICD-10-CM | POA: Diagnosis not present

## 2016-03-27 MED ORDER — VARENICLINE TARTRATE 1 MG PO TABS: 1.0000 mg | ORAL_TABLET | Freq: Two times a day (BID) | ORAL | 1 refills | Status: DC

## 2016-03-27 MED ORDER — PREGABALIN 150 MG PO CAPS: ORAL_CAPSULE | ORAL | 1 refills | Status: DC

## 2016-03-27 MED ORDER — NYSTATIN 100000 UNIT/GM EX POWD: Freq: Four times a day (QID) | CUTANEOUS | 1 refills | Status: DC

## 2016-03-27 MED ORDER — METOPROLOL TARTRATE 100 MG PO TABS: ORAL_TABLET | ORAL | 1 refills | Status: DC

## 2016-03-27 MED ORDER — SIMVASTATIN 40 MG PO TABS
ORAL_TABLET | ORAL | 1 refills | Status: DC
Start: 2016-03-27 — End: 2016-12-09

## 2016-03-27 MED ORDER — FEXOFENADINE HCL 180 MG PO TABS: 180.0000 mg | ORAL_TABLET | Freq: Every day | ORAL | 6 refills | Status: DC

## 2016-03-27 MED ORDER — VARENICLINE TARTRATE 0.5 MG X 11 & 1 MG X 42 PO MISC: ORAL | 0 refills | Status: DC

## 2016-03-27 MED ORDER — OMEPRAZOLE 40 MG PO CPDR: DELAYED_RELEASE_CAPSULE | ORAL | 1 refills | Status: DC

## 2016-03-27 NOTE — Assessment & Plan Note (Signed)
Under good control. Continue current regimen. Continue to monitor.  

## 2016-03-27 NOTE — Assessment & Plan Note (Signed)
Rechecking levels today. Await results.  

## 2016-03-27 NOTE — Progress Notes (Signed)
BP 134/81 (BP Location: Left Arm, Patient Position: Sitting, Cuff Size: Normal)   Pulse 67   Temp 98.1 F (36.7 C)   Wt 218 lb 9.6 oz (99.2 kg)   SpO2 99%   BMI 29.98 kg/m    Subjective:    Patient ID: Michael Bonilla, male    DOB: Aug 13, 1955, 60 y.o.   MRN: [id number]  HPI: Michael Bonilla is a 60 y.o. male  Chief Complaint  Patient presents with  . Nicotine Dependence  . Plantar Fasciitis   HYPERTENSION / Roberts Satisfied with current treatment? yes Duration of hypertension: chronic BP monitoring frequency: not checking BP medication side effects: no Duration of hyperlipidemia: chronic Cholesterol medication side effects: no Cholesterol supplements: fish oil Past cholesterol medications: simvastatin (zocor) Medication compliance: excellent compliance Aspirin: yes Recent stressors: no Recurrent headaches: no Visual changes: no Palpitations: no Dyspnea: no Chest pain: no Lower extremity edema: no Dizzy/lightheaded: no   FOOT PAIN Duration: 3 months Involved foot: left Mechanism of injury: unknown Location: bottom of his foot Onset: sudden  Severity: severe  Quality:  burning, cramping and throbbing Frequency: constant Radiation: no Aggravating factors: walking  Alleviating factors: nothing  Status: worse Treatments attempted: prednisone, rest, ice, heat, APAP, ibuprofen, aleve and HEP , no brace Relief with NSAIDs?:  no Weakness with weight bearing or walking: no Morning stiffness: no Swelling: no Redness: no Bruising: yes Paresthesias / decreased sensation: no  Fevers:no  NEUROPATHY Neuropathy status: better  Satisfied with current treatment?: yes Medication side effects: no Medication compliance:  excellent compliance Location: R leg Pain: yes Severity: mild  Quality:  pins and needles Frequency: constant Bilateral: no Symmetric: no Numbness: yes Decreased sensation: yes Weakness: yes Context: stable  SMOKING  CESSATION Smoking Status: current every day smoker Smoking Amount: 1/2 ppd Smoking Onset: 60yo Smoking Quit Date: not set Smoking triggers: first thing when he wakes up Type of tobacco use: cigarettes Children in the house: no Other household members who smoke: no   Relevant past medical, surgical, family and social history reviewed and updated as indicated. Interim medical history since our last visit reviewed. Allergies and medications reviewed and updated.  Review of Systems  Constitutional: Negative.   Respiratory: Negative.   Cardiovascular: Negative.   Musculoskeletal: Positive for myalgias. Negative for arthralgias, back pain, gait problem, joint swelling, neck pain and neck stiffness.  Psychiatric/Behavioral: Negative.     Per HPI unless specifically indicated above     Objective:    BP 134/81 (BP Location: Left Arm, Patient Position: Sitting, Cuff Size: Normal)   Pulse 67   Temp 98.1 F (36.7 C)   Wt 218 lb 9.6 oz (99.2 kg)   SpO2 99%   BMI 29.98 kg/m   Wt Readings from Last 3 Encounters:  03/27/16 218 lb 9.6 oz (99.2 kg)  02/27/16 218 lb (98.9 kg)  09/06/15 216 lb (98 kg)    Physical Exam  Constitutional: He is oriented to person, place, and time. He appears well-developed and well-nourished. No distress.  HENT:  Head: Normocephalic and atraumatic.  Right Ear: Hearing normal.  Left Ear: Hearing normal.  Nose: Nose normal.  Eyes: Conjunctivae and lids are normal. Right eye exhibits no discharge. Left eye exhibits no discharge. No scleral icterus.  Cardiovascular: Normal rate, regular rhythm, normal heart sounds and intact distal pulses.  Exam reveals no gallop and no friction rub.   No murmur heard. Pulmonary/Chest: Effort normal and breath sounds normal. No respiratory distress. He has no wheezes.  He has no rales. He exhibits no tenderness.  Musculoskeletal: Normal range of motion.  Neurological: He is alert and oriented to person, place, and time.   Skin: Skin is warm, dry and intact. No rash noted. No erythema. No pallor.  1.5cm raised irritated lesion over R scapular border   Psychiatric: He has a normal mood and affect. His speech is normal and behavior is normal. Judgment and thought content normal. Cognition and memory are normal.  Nursing note and vitals reviewed.   Results for orders placed or performed in visit on 07/03/15  Rapid strep screen (not at [facility])  Result Value Ref Range   Strep Gp A Ag, IA W/Reflex Negative Negative  Culture, Group A Strep  Result Value Ref Range   Strep A Culture Negative       Assessment & Plan:   Problem List Items Addressed This Visit      Digestive   GERD (gastroesophageal reflux disease)    Under good control. Continue current regimen. Continue to monitor.      Relevant Medications   omeprazole (PRILOSEC) 40 MG capsule     Nervous and Auditory   Peripheral neuropathy (HCC)    Under good control. Continue current regimen. Continue to monitor.      Relevant Medications   varenicline (CHANTIX STARTING MONTH PAK) 0.5 MG X 11 & 1 MG X 42 tablet   varenicline (CHANTIX CONTINUING MONTH PAK) 1 MG tablet   pregabalin (LYRICA) 150 MG capsule     Genitourinary   Hypertensive CKD (chronic kidney disease)    Under good control. Continue current regimen. Continue to monitor.      CKD (chronic kidney disease), stage I    Rechecking levels today. Await results.         Other   Hyperlipidemia    Under good control. Continue current regimen. Continue to monitor.      Relevant Medications   simvastatin (ZOCOR) 40 MG tablet   metoprolol (LOPRESSOR) 100 MG tablet    Other Visit Diagnoses    Plantar fasciitis of left foot    -  Primary   Will get dorsiflexion brace. If not better by next visit will go to podiatry   Neoplasm of uncertain behavior of skin       Will return for shave biopsy next visit.    Tobacco abuse       Would like to start chantix. Rx given today. Follow up 1  month.       Follow up plan: Return in about 4 weeks (around 04/24/2016) for shave biopsy on back, follow up plantar fasciitis and smoking.

## 2016-03-28 ENCOUNTER — Encounter: Payer: Self-pay | Admitting: Family Medicine

## 2016-03-28 LAB — COMPREHENSIVE METABOLIC PANEL
A/G RATIO: 1.8 (ref 1.2–2.2)
ALK PHOS: 92 IU/L (ref 39–117)
ALT: 56 IU/L — AB (ref 0–44)
AST: 45 IU/L — AB (ref 0–40)
Albumin: 4.2 g/dL (ref 3.6–4.8)
BILIRUBIN TOTAL: 0.4 mg/dL (ref 0.0–1.2)
BUN/Creatinine Ratio: 9 — ABNORMAL LOW (ref 10–24)
BUN: 11 mg/dL (ref 8–27)
CHLORIDE: 99 mmol/L (ref 96–106)
CO2: 24 mmol/L (ref 18–29)
Calcium: 9 mg/dL (ref 8.6–10.2)
Creatinine, Ser: 1.19 mg/dL (ref 0.76–1.27)
GFR calc non Af Amer: 65 mL/min/{1.73_m2} (ref >59)
GFR, EST AFRICAN AMERICAN: 75 mL/min/{1.73_m2} (ref >59)
GLUCOSE: 104 mg/dL — AB (ref 65–99)
Globulin, Total: 2.4 g/dL (ref 1.5–4.5)
POTASSIUM: 4 mmol/L (ref 3.5–5.2)
Sodium: 141 mmol/L (ref 134–144)
TOTAL PROTEIN: 6.6 g/dL (ref 6.0–8.5)

## 2016-03-28 LAB — MICROALBUMIN, URINE WAIVED
Creatinine, Urine Waived: 50 mg/dL (ref 10–300)
MICROALB, UR WAIVED: 10 mg/L (ref 0–19)

## 2016-03-28 LAB — UA/M W/RFLX CULTURE, ROUTINE
BILIRUBIN UA: NEGATIVE
Glucose, UA: NEGATIVE
KETONES UA: NEGATIVE
LEUKOCYTES UA: NEGATIVE
NITRITE UA: NEGATIVE
PROTEIN UA: NEGATIVE
RBC UA: NEGATIVE
Urobilinogen, Ur: 0.2 mg/dL (ref 0.2–1.0)
pH, UA: 6.5 (ref 5.0–7.5)

## 2016-03-28 LAB — CBC WITH DIFFERENTIAL/PLATELET
BASOS ABS: 0 10*3/uL (ref 0.0–0.2)
BASOS: 1 %
EOS (ABSOLUTE): 0 10*3/uL (ref 0.0–0.4)
Eos: 1 %
Hematocrit: 44.1 % (ref 37.5–51.0)
Hemoglobin: 14.8 g/dL (ref 13.0–17.7)
Immature Grans (Abs): 0 10*3/uL (ref 0.0–0.1)
Immature Granulocytes: 0 %
LYMPHS ABS: 1.6 10*3/uL (ref 0.7–3.1)
LYMPHS: 26 %
MCH: 31.2 pg (ref 26.6–33.0)
MCHC: 33.6 g/dL (ref 31.5–35.7)
MCV: 93 fL (ref 79–97)
MONOS ABS: 0.7 10*3/uL (ref 0.1–0.9)
Monocytes: 11 %
NEUTROS ABS: 3.6 10*3/uL (ref 1.4–7.0)
Neutrophils: 61 %
PLATELETS: 132 10*3/uL — AB (ref 150–379)
RBC: 4.74 x10E6/uL (ref 4.14–5.80)
RDW: 13.9 % (ref 12.3–15.4)
WBC: 5.9 10*3/uL (ref 3.4–10.8)

## 2016-03-28 LAB — LIPID PANEL PICCOLO, WAIVED
CHOLESTEROL PICCOLO, WAIVED: 134 mg/dL (ref <200)
Chol/HDL Ratio Piccolo,Waive: 3.3 mg/dL
HDL Chol Piccolo, Waived: 40 mg/dL — ABNORMAL LOW (ref >59)
LDL Chol Calc Piccolo Waived: 17 mg/dL (ref <100)
TRIGLYCERIDES PICCOLO,WAIVED: 387 mg/dL — AB (ref <150)
VLDL Chol Calc Piccolo,Waive: 77 mg/dL — ABNORMAL HIGH (ref <30)

## 2016-03-28 LAB — TSH: TSH: 2.51 u[IU]/mL (ref 0.450–4.500)

## 2016-04-25 ENCOUNTER — Encounter: Payer: Self-pay | Admitting: Family Medicine

## 2016-04-25 ENCOUNTER — Ambulatory Visit (INDEPENDENT_AMBULATORY_CARE_PROVIDER_SITE_OTHER): Payer: BLUE CROSS/BLUE SHIELD | Admitting: Family Medicine

## 2016-04-25 VITALS — BP 129/79 | HR 58 | Temp 97.9°F | Ht 72.0 in | Wt 214.0 lb

## 2016-04-25 DIAGNOSIS — D485 Neoplasm of uncertain behavior of skin: Secondary | ICD-10-CM | POA: Diagnosis not present

## 2016-04-25 DIAGNOSIS — J40 Bronchitis, not specified as acute or chronic: Secondary | ICD-10-CM | POA: Diagnosis not present

## 2016-04-25 DIAGNOSIS — M722 Plantar fascial fibromatosis: Secondary | ICD-10-CM | POA: Diagnosis not present

## 2016-04-25 MED ORDER — ALBUTEROL SULFATE HFA 108 (90 BASE) MCG/ACT IN AERS: 2.0000 | INHALATION_SPRAY | Freq: Four times a day (QID) | RESPIRATORY_TRACT | 0 refills | Status: DC | PRN

## 2016-04-25 NOTE — Progress Notes (Signed)
BP 129/79 (BP Location: Left Arm, Patient Position: Sitting, Cuff Size: Normal)   Pulse (!) 58   Temp 97.9 F (36.6 C)   Ht 6' (1.829 m)   Wt 214 lb (97.1 kg)   SpO2 99%   BMI 29.02 kg/m    Subjective:    Patient ID: Michael Bonilla, male    DOB: 20-Oct-1955, 61 y.o.   MRN: [id number]  HPI: Michael Bonilla is a 61 y.o. male  Chief Complaint  Patient presents with  . URI   UPPER RESPIRATORY TRACT INFECTION Duration: 1 week Worst symptom: cough, burning eyes Fever: no Cough: yes Shortness of breath: yes Wheezing: yes Chest pain: no Chest tightness: yes Chest congestion: yes Nasal congestion: yes Runny nose: no Post nasal drip: no Sneezing: no Sore throat: no Swollen glands: no Sinus pressure: no Headache: no Face pain: no Toothache: no Ear pain: no  Ear pressure: no  Eyes red/itching:no Eye drainage/crusting: no  Vomiting: no Rash: no Fatigue: yes Sick contacts: no Strep contacts: no  Context: better Recurrent sinusitis: no Relief with OTC cold/cough medications: no  Treatments attempted: mucinex and antibiotics    Foot is doing a bit better. Still really hurting. Doesn't want to see podiatry.  Hasn't started the medicine for smoking yet because of the holidays. He is otherwise doing well with no other concerns or complaints at this time.    Relevant past medical, surgical, family and social history reviewed and updated as indicated. Interim medical history since our last visit reviewed. Allergies and medications reviewed and updated.  Review of Systems  Constitutional: Positive for fatigue. Negative for activity change, appetite change, chills, diaphoresis, fever and unexpected weight change.  HENT: Positive for congestion, postnasal drip and rhinorrhea. Negative for dental problem, drooling, ear discharge, ear pain, facial swelling, hearing loss, mouth sores, nosebleeds, sinus pain, sinus pressure, sneezing, sore throat, tinnitus, trouble  swallowing and voice change.   Respiratory: Positive for cough, shortness of breath and wheezing. Negative for apnea, choking, chest tightness and stridor.   Cardiovascular: Negative.   Psychiatric/Behavioral: Negative.     Per HPI unless specifically indicated above     Objective:    BP 129/79 (BP Location: Left Arm, Patient Position: Sitting, Cuff Size: Normal)   Pulse (!) 58   Temp 97.9 F (36.6 C)   Ht 6' (1.829 m)   Wt 214 lb (97.1 kg)   SpO2 99%   BMI 29.02 kg/m   Wt Readings from Last 3 Encounters:  04/25/16 214 lb (97.1 kg)  03/27/16 218 lb 9.6 oz (99.2 kg)  02/27/16 218 lb (98.9 kg)    Physical Exam  Constitutional: He is oriented to person, place, and time. He appears well-developed and well-nourished. No distress.  HENT:  Head: Normocephalic and atraumatic.  Right Ear: Hearing normal.  Left Ear: Hearing normal.  Nose: Nose normal.  Eyes: Conjunctivae and lids are normal. Right eye exhibits no discharge. Left eye exhibits no discharge. No scleral icterus.  Cardiovascular: Normal rate, regular rhythm and intact distal pulses.  Exam reveals friction rub. Exam reveals no gallop.   No murmur heard. Pulmonary/Chest: Effort normal. No respiratory distress. He has wheezes. He has no rales. He exhibits no tenderness.  Musculoskeletal: Normal range of motion.  Neurological: He is alert and oriented to person, place, and time.  Skin: Skin is warm, dry and intact. No rash noted. No erythema. No pallor.  1.5cm raised irritated lesion over R scapular border    Psychiatric: He  has a normal mood and affect. His speech is normal and behavior is normal. Judgment and thought content normal. Cognition and memory are normal.  Nursing note and vitals reviewed.   Results for orders placed or performed in visit on 03/27/16  CBC with Differential/Platelet  Result Value Ref Range   WBC 5.9 3.4 - 10.8 x10E3/uL   RBC 4.74 4.14 - 5.80 x10E6/uL   Hemoglobin 14.8 13.0 - 17.7 g/dL    Hematocrit 44.1 37.5 - 51.0 %   MCV 93 79 - 97 fL   MCH 31.2 26.6 - 33.0 pg   MCHC 33.6 31.5 - 35.7 g/dL   RDW 13.9 12.3 - 15.4 %   Platelets 132 (L) 150 - 379 x10E3/uL   Neutrophils 61 Not Estab. %   Lymphs 26 Not Estab. %   Monocytes 11 Not Estab. %   Eos 1 Not Estab. %   Basos 1 Not Estab. %   Neutrophils Absolute 3.6 1.4 - 7.0 x10E3/uL   Lymphocytes Absolute 1.6 0.7 - 3.1 x10E3/uL   Monocytes Absolute 0.7 0.1 - 0.9 x10E3/uL   EOS (ABSOLUTE) 0.0 0.0 - 0.4 x10E3/uL   Basophils Absolute 0.0 0.0 - 0.2 x10E3/uL   Immature Granulocytes 0 Not Estab. %   Immature Grans (Abs) 0.0 0.0 - 0.1 x10E3/uL  Comprehensive metabolic panel  Result Value Ref Range   Glucose 104 (H) 65 - 99 mg/dL   BUN 11 8 - 27 mg/dL   Creatinine, Ser 1.19 0.76 - 1.27 mg/dL   GFR calc non Af Amer 65 >59 mL/min/1.73   GFR calc Af Amer 75 >59 mL/min/1.73   BUN/Creatinine Ratio 9 (L) 10 - 24   Sodium 141 134 - 144 mmol/L   Potassium 4.0 3.5 - 5.2 mmol/L   Chloride 99 96 - 106 mmol/L   CO2 24 18 - 29 mmol/L   Calcium 9.0 8.6 - 10.2 mg/dL   Total Protein 6.6 6.0 - 8.5 g/dL   Albumin 4.2 3.6 - 4.8 g/dL   Globulin, Total 2.4 1.5 - 4.5 g/dL   Albumin/Globulin Ratio 1.8 1.2 - 2.2   Bilirubin Total 0.4 0.0 - 1.2 mg/dL   Alkaline Phosphatase 92 39 - 117 IU/L   AST 45 (H) 0 - 40 IU/L   ALT 56 (H) 0 - 44 IU/L  Lipid Panel Piccolo, Waived  Result Value Ref Range   Cholesterol Piccolo, Waived 134 <200 mg/dL   HDL Chol Piccolo, Waived 40 (L) >59 mg/dL   Triglycerides Piccolo,Waived 387 (H) <150 mg/dL   Chol/HDL Ratio Piccolo,Waive 3.3 mg/dL   LDL Chol Calc Piccolo Waived 17 <100 mg/dL   VLDL Chol Calc Piccolo,Waive 77 (H) <30 mg/dL  Microalbumin, Urine Waived  Result Value Ref Range   Microalb, Ur Waived 10 0 - 19 mg/L   Creatinine, Urine Waived 50 10 - 300 mg/dL   Microalb/Creat Ratio 30-300 (H) <30 mg/g  TSH  Result Value Ref Range   TSH 2.510 0.450 - 4.500 uIU/mL  UA/M w/rflx Culture, Routine  Result Value  Ref Range   Specific Gravity, UA <1.005 (L) 1.005 - 1.030   pH, UA 6.5 5.0 - 7.5   Color, UA Yellow Yellow   Appearance Ur Clear Clear   Leukocytes, UA Negative Negative   Protein, UA Negative Negative/Trace   Glucose, UA Negative Negative   Ketones, UA Negative Negative   RBC, UA Negative Negative   Bilirubin, UA Negative Negative   Urobilinogen, Ur 0.2 0.2 - 1.0 mg/dL   Nitrite,  UA Negative Negative      Assessment & Plan:   Problem List Items Addressed This Visit    None    Visit Diagnoses    Bronchitis    -  Primary   Continue minocycline. Will start albuterol. Return for lung recheck in 2 weeks. Call with any concerns.    Neoplasm of uncertain behavior of skin       Shave biopsied and sent for evalutation today.   Relevant Orders   Pathology Report   Plantar fasciitis of left foot       Not significantly better. Does not want to see podiatry. Continue to monitor.      Skin Procedure  Procedure: Informed consent given.  Sterile prep of the area.  Area infiltrated with lidocaine with epinephrine.  Using a dermablade, part of the upper dermis shaved off and sent  for pathology.  Area cauterized. Pt ed on scarring.     Diagnosis:   ICD-9-CM ICD-10-CM   1. Bronchitis 490 J40    Continue minocycline. Will start albuterol. Return for lung recheck in 2 weeks. Call with any concerns.   2. Neoplasm of uncertain behavior of skin 238.2 D48.5 Pathology Report   Shave biopsied and sent for evalutation today.  3. Plantar fasciitis of left foot 728.71 M72.2    Not significantly better. Does not want to see podiatry. Continue to monitor.    Lesion Location/Size: 1.5cm R scapular border Physician: MJ Consent:  Risks, benefits, and alternative treatments discussed and all questions were answered.  Patient elected to proceed and verbal consent obtained.  Description: Area prepped and draped using semi-sterile technique. Area locally anesthetized using 4.0 cc's of lidocaine 2% with  epi. Shave biopsy of lesion performed using a dermablade.  Adequate hemostastis achieved using Silver Nitrate   Follow up plan: Return in about 2 weeks (around 05/09/2016) for lung recheck.

## 2016-04-29 LAB — PATHOLOGY

## 2016-05-02 ENCOUNTER — Telehealth: Payer: Self-pay | Admitting: Family Medicine

## 2016-05-02 DIAGNOSIS — C44629 Squamous cell carcinoma of skin of left upper limb, including shoulder: Secondary | ICD-10-CM

## 2016-05-02 DIAGNOSIS — C44622 Squamous cell carcinoma of skin of right upper limb, including shoulder: Secondary | ICD-10-CM

## 2016-05-02 NOTE — Telephone Encounter (Signed)
Spoke with patient and read results as noted by Dr. Wynetta Emery.

## 2016-05-02 NOTE — Telephone Encounter (Signed)
Called and [facility] for Michael Bonilla to call back. The mole we took off his back looks like it was a skin cancer, so we need to get him into dermatology. I've put the referral in for him and they should be calling him shortly.

## 2016-05-12 ENCOUNTER — Telehealth: Payer: Self-pay | Admitting: Family Medicine

## 2016-05-12 NOTE — Telephone Encounter (Signed)
Patient notified of appointment.  

## 2016-05-29 ENCOUNTER — Ambulatory Visit (INDEPENDENT_AMBULATORY_CARE_PROVIDER_SITE_OTHER): Payer: BLUE CROSS/BLUE SHIELD | Admitting: Family Medicine

## 2016-05-29 ENCOUNTER — Encounter: Payer: Self-pay | Admitting: Family Medicine

## 2016-05-29 VITALS — BP 162/89 | HR 62 | Temp 97.4°F | Wt 213.0 lb

## 2016-05-29 DIAGNOSIS — B9789 Other viral agents as the cause of diseases classified elsewhere: Secondary | ICD-10-CM | POA: Diagnosis not present

## 2016-05-29 DIAGNOSIS — J069 Acute upper respiratory infection, unspecified: Secondary | ICD-10-CM

## 2016-05-29 LAB — VERITOR FLU A/B WAIVED
INFLUENZA A: NEGATIVE
INFLUENZA B: NEGATIVE

## 2016-05-29 NOTE — Progress Notes (Signed)
   BP (!) 162/89   Pulse 62   Temp 97.4 F (36.3 C)   Wt 213 lb (96.6 kg)   SpO2 100%   BMI 28.89 kg/m    Subjective:    Patient ID: Jeanine Luz, male    DOB: 08-Jun-1955, 61 y.o.   MRN: [id number]  HPI: TRAYTON YELEY is a 61 y.o. male  Chief Complaint  Patient presents with  . URI    x 2 days, body aches, head is swimming/dizzy, weak, slight cough, no known fever. Not really any head or chest congestion. No sore throat.   Dizzy, body aches - severe, fatigue, cough, scratchy throat. Denies fever, sweats, chills, N/V, HA. Has not been taking anything for sxs, just trying to stay hydrated. Several sick contacts.   Relevant past medical, surgical, family and social history reviewed and updated as indicated. Interim medical history since our last visit reviewed. Allergies and medications reviewed and updated.  Review of Systems  Constitutional: Positive for fatigue.  HENT: Positive for sore throat.   Eyes: Negative.   Respiratory: Positive for cough.   Cardiovascular: Negative.   Gastrointestinal: Negative.   Genitourinary: Negative.   Musculoskeletal: Positive for myalgias.  Neurological: Positive for dizziness.  Psychiatric/Behavioral: Negative.     Per HPI unless specifically indicated above     Objective:    BP (!) 162/89   Pulse 62   Temp 97.4 F (36.3 C)   Wt 213 lb (96.6 kg)   SpO2 100%   BMI 28.89 kg/m   Wt Readings from Last 3 Encounters:  05/29/16 213 lb (96.6 kg)  04/25/16 214 lb (97.1 kg)  03/27/16 218 lb 9.6 oz (99.2 kg)    Physical Exam  Constitutional: He is oriented to person, place, and time. He appears well-developed and well-nourished. No distress.  HENT:  Head: Atraumatic.  Right Ear: External ear normal.  Left Ear: External ear normal.  Nose: Nose normal.  Mouth/Throat: Oropharynx is clear and moist.  Middle ear effusion b/l  Eyes: Conjunctivae are normal. Pupils are equal, round, and reactive to light. No scleral icterus.    Neck: Normal range of motion. Neck supple.  Cardiovascular: Normal rate, regular rhythm and normal heart sounds.   Pulmonary/Chest: Effort normal and breath sounds normal. No respiratory distress.  Musculoskeletal: Normal range of motion. He exhibits no tenderness.  Neurological: He is alert and oriented to person, place, and time.  Skin: Skin is warm and dry.  Psychiatric: He has a normal mood and affect. His behavior is normal.  Nursing note and vitals reviewed.     Assessment & Plan:   Problem List Items Addressed This Visit    None    Visit Diagnoses    Viral URI    -  Primary   Rapid flu negative, symptomatic tx with ibuprofen, tylenol, sudafed, flonase prn. Suspect dizziness from ear pressure Follow up if no improvement.    Relevant Orders   Influenza A & B (STAT)       Follow up plan: Return if symptoms worsen or fail to improve.

## 2016-05-29 NOTE — Patient Instructions (Signed)
Sudafed, flonase, tylenol, ibuprofen

## 2016-07-25 ENCOUNTER — Telehealth: Payer: Self-pay | Admitting: Family Medicine

## 2016-07-25 NOTE — Telephone Encounter (Signed)
Spoke with pharmacy who stated rx was on hold from December and did we want to go ahead and refill this. Let pharmacy know he needed the continuing pack and they would go ahead and fill that and let patient know when ready.

## 2016-07-25 NOTE — Telephone Encounter (Signed)
It should be at the pharmacy for him already.

## 2016-07-25 NOTE — Telephone Encounter (Signed)
Pt called and stated the chantix is working good and would like to have a refill sent to [facility].

## 2016-07-28 ENCOUNTER — Ambulatory Visit (INDEPENDENT_AMBULATORY_CARE_PROVIDER_SITE_OTHER): Payer: BLUE CROSS/BLUE SHIELD | Admitting: Family Medicine

## 2016-07-28 ENCOUNTER — Encounter: Payer: Self-pay | Admitting: Family Medicine

## 2016-07-28 VITALS — BP 138/82 | HR 62 | Temp 98.2°F | Resp 17 | Ht 72.0 in | Wt 205.0 lb

## 2016-07-28 DIAGNOSIS — R509 Fever, unspecified: Secondary | ICD-10-CM | POA: Diagnosis not present

## 2016-07-28 DIAGNOSIS — R112 Nausea with vomiting, unspecified: Secondary | ICD-10-CM

## 2016-07-28 LAB — UA/M W/RFLX CULTURE, ROUTINE
Bilirubin, UA: NEGATIVE
Ketones, UA: NEGATIVE
Leukocytes, UA: NEGATIVE
Nitrite, UA: NEGATIVE
Specific Gravity, UA: 1.02 (ref 1.005–1.030)
Urobilinogen, Ur: 0.2 mg/dL (ref 0.2–1.0)
pH, UA: 7 (ref 5.0–7.5)

## 2016-07-28 LAB — CBC WITH DIFFERENTIAL/PLATELET
Hematocrit: 51.5 % — ABNORMAL HIGH (ref 37.5–51.0)
Hemoglobin: 17.6 g/dL (ref 13.0–17.7)
LYMPHS ABS: 1.2 10*3/uL (ref 0.7–3.1)
LYMPHS: 17 %
MCH: 31.7 pg (ref 26.6–33.0)
MCHC: 34.2 g/dL (ref 31.5–35.7)
MCV: 93 fL (ref 79–97)
MID (ABSOLUTE): 0.6 10*3/uL (ref 0.1–1.6)
MID: 9 %
NEUTROS PCT: 74 %
Neutrophils Absolute: 5.1 10*3/uL (ref 1.4–7.0)
PLATELETS: 120 10*3/uL — AB (ref 150–379)
RBC: 5.56 x10E6/uL (ref 4.14–5.80)
RDW: 14 % (ref 12.3–15.4)
WBC: 6.9 10*3/uL (ref 3.4–10.8)

## 2016-07-28 LAB — MICROSCOPIC EXAMINATION
Bacteria, UA: NONE SEEN
RBC, UA: NONE SEEN /HPF (ref 0–?)
WBC, UA: NONE SEEN /HPF (ref 0–?)

## 2016-07-28 MED ORDER — ONDANSETRON 4 MG PO TBDP: 4.0000 mg | ORAL_TABLET | Freq: Three times a day (TID) | ORAL | 0 refills | Status: DC | PRN

## 2016-07-28 NOTE — Progress Notes (Signed)
BP 138/82   Pulse 62   Temp 98.2 F (36.8 C) (Oral)   Resp 17   Ht 6' (1.829 m)   Wt 205 lb (93 kg)   SpO2 99%   BMI 27.80 kg/m    Subjective:    Patient ID: Michael Bonilla, male    DOB: [birth year], 61 y.o.   MRN: [id number]  HPI: Michael Bonilla is a 61 y.o. male  Chief Complaint  Patient presents with  . Nausea    onset 1 week on and off  . Diarrhea    Mild  . Headache  . Dizziness    Fell 3 days ago  . Chills    on and off   Got sick with nausea and a bit of diarrhea last Monday/Tuesday. Started feeling better on Thursday, but then Friday, started feeling much worse again.   ABDOMINAL ISSUES Duration: 1 week Nature: Not pain- severe nausea Location: diffuse  Severity: severe  Radiation: no Episode duration: constant Frequency: constant Alleviating factors: Aggravating factors: Treatments attempted: antacids and PPI Constipation: no Diarrhea: no Mucous in the stool: no Heartburn: no Bloating:no Flatulence: no Nausea: yes Vomiting: yes Episodes of vomit/day: 2-3x Melena or hematochezia: no Rash: no Jaundice: no Fever: yes Weight loss: yes  Relevant past medical, surgical, family and social history reviewed and updated as indicated. Interim medical history since our last visit reviewed. Allergies and medications reviewed and updated.  Review of Systems  Constitutional: Negative.   Respiratory: Negative.   Cardiovascular: Negative.   Gastrointestinal: Positive for abdominal distention, nausea and vomiting. Negative for abdominal pain, anal bleeding, blood in stool, constipation, diarrhea and rectal pain.  Psychiatric/Behavioral: Negative.     Per HPI unless specifically indicated above     Objective:    BP 138/82   Pulse 62   Temp 98.2 F (36.8 C) (Oral)   Resp 17   Ht 6' (1.829 m)   Wt 205 lb (93 kg)   SpO2 99%   BMI 27.80 kg/m   Wt Readings from Last 3 Encounters:  07/28/16 205 lb (93 kg)  05/29/16 213 lb (96.6 kg)    04/25/16 214 lb (97.1 kg)    Physical Exam  Constitutional: He is oriented to person, place, and time. He appears well-developed and well-nourished. No distress.  HENT:  Head: Normocephalic and atraumatic.  Right Ear: Hearing normal.  Left Ear: Hearing normal.  Nose: Nose normal.  Eyes: Conjunctivae and lids are normal. Right eye exhibits no discharge. Left eye exhibits no discharge. No scleral icterus.  Cardiovascular: Normal rate, regular rhythm, normal heart sounds and intact distal pulses.  Exam reveals no gallop and no friction rub.   No murmur heard. Pulmonary/Chest: Effort normal and breath sounds normal. No respiratory distress. He has no wheezes. He has no rales. He exhibits no tenderness.  Abdominal: Soft. Bowel sounds are normal. He exhibits distension. He exhibits no mass. There is tenderness. There is no rebound and no guarding.  Musculoskeletal: Normal range of motion.  Neurological: He is alert and oriented to person, place, and time.  Skin: Skin is warm, dry and intact. No rash noted. He is not diaphoretic. No erythema. No pallor.  Psychiatric: He has a normal mood and affect. His speech is normal and behavior is normal. Judgment and thought content normal. Cognition and memory are normal.  Nursing note and vitals reviewed.   Results for orders placed or performed in visit on 05/29/16  Influenza A & B (STAT)  Result  Value Ref Range   Influenza A Negative Negative   Influenza B Negative Negative      Assessment & Plan:   Problem List Items Addressed This Visit    None    Visit Diagnoses    Fever, unspecified fever cause    -  Primary   UA and WBC normal today. Likely due to gastroenteritis. Will treat with zofran and push fluids. Monitor. Call if not getting better in next couple of days.    Relevant Orders   CBC With Differential/Platelet   Comprehensive metabolic panel   UA/M w/rflx Culture, Routine   Non-intractable vomiting with nausea, unspecified  vomiting type       UA and WBC normal today. Likely due to gastroenteritis. Will treat with zofran and push fluids. Monitor. Call if not getting better in next couple of days.    Relevant Orders   Comprehensive metabolic panel   Lipase   Amylase   UA/M w/rflx Culture, Routine       Follow up plan: Return if symptoms worsen or fail to improve.

## 2016-07-29 LAB — COMPREHENSIVE METABOLIC PANEL
A/G RATIO: 1.6 (ref 1.2–2.2)
ALK PHOS: 88 IU/L (ref 39–117)
ALT: 81 IU/L — ABNORMAL HIGH (ref 0–44)
AST: 98 IU/L — AB (ref 0–40)
Albumin: 4.6 g/dL (ref 3.6–4.8)
BUN/Creatinine Ratio: 10 (ref 10–24)
BUN: 12 mg/dL (ref 8–27)
Bilirubin Total: 0.6 mg/dL (ref 0.0–1.2)
CO2: 26 mmol/L (ref 18–29)
Calcium: 10 mg/dL (ref 8.6–10.2)
Chloride: 99 mmol/L (ref 96–106)
Creatinine, Ser: 1.16 mg/dL (ref 0.76–1.27)
GFR calc Af Amer: 78 mL/min/{1.73_m2} (ref >59)
GFR, EST NON AFRICAN AMERICAN: 67 mL/min/{1.73_m2} (ref >59)
GLOBULIN, TOTAL: 2.9 g/dL (ref 1.5–4.5)
Glucose: 106 mg/dL — ABNORMAL HIGH (ref 65–99)
POTASSIUM: 4.3 mmol/L (ref 3.5–5.2)
SODIUM: 140 mmol/L (ref 134–144)
Total Protein: 7.5 g/dL (ref 6.0–8.5)

## 2016-07-29 LAB — LIPASE: LIPASE: 72 U/L (ref 13–78)

## 2016-07-29 LAB — AMYLASE: AMYLASE: 45 U/L (ref 31–124)

## 2016-07-30 ENCOUNTER — Telehealth: Payer: Self-pay | Admitting: Family Medicine

## 2016-07-30 NOTE — Telephone Encounter (Signed)
Called to let him know that all his labs came back normal. He is feeling much better. He was actually able to eat. He is almost back to himself.

## 2016-09-04 ENCOUNTER — Other Ambulatory Visit: Payer: Self-pay | Admitting: Family Medicine

## 2016-09-23 ENCOUNTER — Encounter: Payer: Self-pay | Admitting: Unknown Physician Specialty

## 2016-09-23 ENCOUNTER — Ambulatory Visit (INDEPENDENT_AMBULATORY_CARE_PROVIDER_SITE_OTHER): Payer: BLUE CROSS/BLUE SHIELD | Admitting: Unknown Physician Specialty

## 2016-09-23 VITALS — BP 156/88 | HR 68 | Temp 98.3°F | Wt 217.8 lb

## 2016-09-23 DIAGNOSIS — I8390 Asymptomatic varicose veins of unspecified lower extremity: Secondary | ICD-10-CM | POA: Diagnosis not present

## 2016-09-23 NOTE — Progress Notes (Signed)
BP (!) 156/88   Pulse 68   Temp 98.3 F (36.8 C)   Wt 217 lb 12.8 oz (98.8 kg)   SpO2 96%   BMI 29.54 kg/m    Subjective:    Patient ID: Michael Bonilla, male    DOB: [birth year], 61 y.o.   MRN: [id number]  HPI: Michael Bonilla is a 61 y.o. male  Chief Complaint  Patient presents with  . Cyst    pt states that his wife noticed a knot on the outside of his left calf a few weeks ago. Pt states that his leg had been sore when the knot was noticed but has gotten painful since then. He states that it is worse after he has been on his feet for a while and that it felt like it was burning yesterday. Pt also descibes a dull throbbing pain.    Pt with "know" left lateral lower leg.  Noted this for about 2 weeks.  Worse after standing and better after rest.    Relevant past medical, surgical, family and social history reviewed and updated as indicated. Interim medical history since our last visit reviewed. Allergies and medications reviewed and updated.  Review of Systems  Per HPI unless specifically indicated above     Objective:    BP (!) 156/88   Pulse 68   Temp 98.3 F (36.8 C)   Wt 217 lb 12.8 oz (98.8 kg)   SpO2 96%   BMI 29.54 kg/m   Wt Readings from Last 3 Encounters:  09/23/16 217 lb 12.8 oz (98.8 kg)  07/28/16 205 lb (93 kg)  05/29/16 213 lb (96.6 kg)    Physical Exam  Constitutional: He is oriented to person, place, and time. He appears well-developed and well-nourished. No distress.  HENT:  Head: Normocephalic and atraumatic.  Eyes: Conjunctivae and lids are normal. Right eye exhibits no discharge. Left eye exhibits no discharge. No scleral icterus.  Neck: Normal range of motion. Neck supple. No JVD present. Carotid bruit is not present.  Cardiovascular: Normal rate, regular rhythm and normal heart sounds.   Pulmonary/Chest: Effort normal and breath sounds normal. No respiratory distress.  Abdominal: Normal appearance. There is no splenomegaly or  hepatomegaly.  Musculoskeletal: Normal range of motion.  Soft dime sized lump on left lateral lower leg.  Tender to touch.  No erythema  Neurological: He is alert and oriented to person, place, and time.  Skin: Skin is warm, dry and intact. No rash noted. No pallor.  Psychiatric: He has a normal mood and affect. His behavior is normal. Judgment and thought content normal.    Results for orders placed or performed in visit on 07/28/16  Microscopic Examination  Result Value Ref Range   WBC, UA None seen 0 - 5 /hpf   RBC, UA None seen 0 - 2 /hpf   Epithelial Cells (non renal) CANCELED    Bacteria, UA None seen None seen/Few  CBC With Differential/Platelet  Result Value Ref Range   WBC 6.9 3.4 - 10.8 x10E3/uL   RBC 5.56 4.14 - 5.80 x10E6/uL   Hemoglobin 17.6 13.0 - 17.7 g/dL   Hematocrit 51.5 (H) 37.5 - 51.0 %   MCV 93 79 - 97 fL   MCH 31.7 26.6 - 33.0 pg   MCHC 34.2 31.5 - 35.7 g/dL   RDW 14.0 12.3 - 15.4 %   Platelets 120 (L) 150 - 379 x10E3/uL   Neutrophils 74 Not Estab. %   Lymphs 17 Not  Estab. %   MID 9 Not Estab. %   Neutrophils Absolute 5.1 1.4 - 7.0 x10E3/uL   Lymphocytes Absolute 1.2 0.7 - 3.1 x10E3/uL   MID (Absolute) 0.6 0.1 - 1.6 X10E3/uL  Comprehensive metabolic panel  Result Value Ref Range   Glucose 106 (H) 65 - 99 mg/dL   BUN 12 8 - 27 mg/dL   Creatinine, Ser 1.16 0.76 - 1.27 mg/dL   GFR calc non Af Amer 67 >59 mL/min/1.73   GFR calc Af Amer 78 >59 mL/min/1.73   BUN/Creatinine Ratio 10 10 - 24   Sodium 140 134 - 144 mmol/L   Potassium 4.3 3.5 - 5.2 mmol/L   Chloride 99 96 - 106 mmol/L   CO2 26 18 - 29 mmol/L   Calcium 10.0 8.6 - 10.2 mg/dL   Total Protein 7.5 6.0 - 8.5 g/dL   Albumin 4.6 3.6 - 4.8 g/dL   Globulin, Total 2.9 1.5 - 4.5 g/dL   Albumin/Globulin Ratio 1.6 1.2 - 2.2   Bilirubin Total 0.6 0.0 - 1.2 mg/dL   Alkaline Phosphatase 88 39 - 117 IU/L   AST 98 (H) 0 - 40 IU/L   ALT 81 (H) 0 - 44 IU/L  Lipase  Result Value Ref Range   Lipase 72 13 -  78 U/L  Amylase  Result Value Ref Range   Amylase 45 31 - 124 U/L  UA/M w/rflx Culture, Routine  Result Value Ref Range   Specific Gravity, UA 1.020 1.005 - 1.030   pH, UA 7.0 5.0 - 7.5   Color, UA Yellow Yellow   Appearance Ur Clear Clear   Leukocytes, UA Negative Negative   Protein, UA 1+ (A) Negative/Trace   Glucose, UA Trace (A) Negative   Ketones, UA Negative Negative   RBC, UA Trace (A) Negative   Bilirubin, UA Negative Negative   Urobilinogen, Ur 0.2 0.2 - 1.0 mg/dL   Nitrite, UA Negative Negative   Microscopic Examination See below:       Assessment & Plan:   Problem List Items Addressed This Visit    None    Visit Diagnoses    Varicose vein of leg    -  Primary   Instructed elevation and warm soaks.  Support hose during the day.  Consider referral to vascular       Follow up plan: Return if symptoms worsen or fail to improve.

## 2016-10-08 ENCOUNTER — Other Ambulatory Visit: Payer: Self-pay | Admitting: Family Medicine

## 2016-10-12 ENCOUNTER — Other Ambulatory Visit: Payer: Self-pay | Admitting: Family Medicine

## 2016-10-13 NOTE — Telephone Encounter (Signed)
OK to call in. Thanks! 

## 2016-10-13 NOTE — Telephone Encounter (Signed)
Called in and left on the prescriber voicemail. 

## 2016-10-27 ENCOUNTER — Telehealth: Payer: Self-pay | Admitting: Family Medicine

## 2016-10-27 NOTE — Telephone Encounter (Signed)
Did you notify the patient or his wife.

## 2016-10-27 NOTE — Telephone Encounter (Signed)
Patient's wife called in regards to pharmacy denying patient's medication for Lyrica due to dosing amount. Pharmacy stating patient takes only one a day. Patient stating that provider stating that patient can take up to 2 a day. Patient usually takes one a day medication for Lyrica.   [facility] [place]  Please Advise.  Thank you

## 2016-10-27 NOTE — Telephone Encounter (Signed)
I tried and there was no answer. LVM for patient to return call.  Just an FYI.

## 2016-10-27 NOTE — Telephone Encounter (Signed)
Medication was approved [id number]

## 2016-10-27 NOTE — Telephone Encounter (Signed)
Insurance is requiring a prior auth on the Lyrica

## 2016-10-27 NOTE — Telephone Encounter (Signed)
PA sent

## 2016-10-31 ENCOUNTER — Telehealth: Payer: Self-pay | Admitting: Family Medicine

## 2016-10-31 NOTE — Telephone Encounter (Signed)
Spoke states that he has been on the Lyrica for a while, now the insurance has increased the price, to where he states that he is not going to pay the price that they want.  Can he be switched back to Gabapentin?

## 2016-11-03 MED ORDER — PREGABALIN 100 MG PO CAPS: ORAL_CAPSULE | ORAL | 0 refills | Status: DC

## 2016-11-03 MED ORDER — GABAPENTIN 100 MG PO CAPS: ORAL_CAPSULE | ORAL | 3 refills | Status: DC

## 2016-11-03 NOTE — Telephone Encounter (Signed)
Patient states that it was still too much with the coupon card.

## 2016-11-03 NOTE — Telephone Encounter (Signed)
Patient notified. He will taper off of the Lyrica

## 2016-11-03 NOTE — Telephone Encounter (Signed)
I have a coupon card that should keep the price down for him. If it doesn't we can switch, he just did do much better on the lyrica than the gabapentin.

## 2016-11-03 NOTE — Telephone Encounter (Signed)
Patient states that the medication is $200.00 so he is not going to buy it.  So when does he need to start the Gabapentin.

## 2016-11-03 NOTE — Telephone Encounter (Signed)
It is very unsafe for him to stop the high dose of lyrica that he is on cold [place]. He needs to wean off of it. He can start the gabapentin whenever he'd like, but he NEEDS to wean off the lyrica. If he needs me to talk to him, please let me know.

## 2016-11-03 NOTE — Telephone Encounter (Signed)
He will need to be weaned off the lyrica. Rx sent to his pharmacy to help him do that.   Will start him back up on the gabapentin. Rx sent to his pharmacy as well.

## 2016-11-04 ENCOUNTER — Telehealth: Payer: Self-pay | Admitting: Family Medicine

## 2016-11-04 MED ORDER — PREGABALIN 100 MG PO CAPS: ORAL_CAPSULE | ORAL | 0 refills | Status: DC

## 2016-11-04 NOTE — Telephone Encounter (Signed)
Patient states that they said that they had 180 tablets for him to pick up. I think they filled the previous one that was sent over for him. Patient states that he has only been taking 300mg  daily, can you update the directions and quantity of the Lyrica and send it to his pharmacy.

## 2016-11-04 NOTE — Telephone Encounter (Signed)
Patient notified of new instructions and new prescription faxed to [facility].

## 2016-11-04 NOTE — Telephone Encounter (Signed)
Michael Bonilla was filled up to the 3 month supply. Patient states he is suppose to being getting winged down on medication and was unsure if the 3 months was hoping he could get a less dosage.  Please Advise.  Thank you

## 2016-11-04 NOTE — Telephone Encounter (Signed)
Rx printed. I think we need to fax it.

## 2016-11-04 NOTE — Telephone Encounter (Signed)
Patient had only been taking 300mg  daily rather than BID. Rx for weaning off changed and printed. OK to fax to his pharmacy

## 2016-12-01 ENCOUNTER — Other Ambulatory Visit: Payer: Self-pay | Admitting: Family Medicine

## 2016-12-09 ENCOUNTER — Other Ambulatory Visit: Payer: Self-pay | Admitting: Family Medicine

## 2016-12-10 ENCOUNTER — Other Ambulatory Visit: Payer: Self-pay | Admitting: Family Medicine

## 2016-12-15 ENCOUNTER — Ambulatory Visit (INDEPENDENT_AMBULATORY_CARE_PROVIDER_SITE_OTHER): Payer: BLUE CROSS/BLUE SHIELD | Admitting: Family Medicine

## 2016-12-15 ENCOUNTER — Encounter: Payer: Self-pay | Admitting: Family Medicine

## 2016-12-15 VITALS — BP 148/84 | HR 54 | Temp 98.0°F | Wt 211.2 lb

## 2016-12-15 DIAGNOSIS — G6289 Other specified polyneuropathies: Secondary | ICD-10-CM | POA: Diagnosis not present

## 2016-12-15 MED ORDER — GABAPENTIN 300 MG PO CAPS: 300.0000 mg | ORAL_CAPSULE | Freq: Three times a day (TID) | ORAL | 1 refills | Status: DC

## 2016-12-15 MED ORDER — NYSTATIN 100000 UNIT/GM EX CREA: 1.0000 "application " | TOPICAL_CREAM | Freq: Two times a day (BID) | CUTANEOUS | 0 refills | Status: DC

## 2016-12-15 MED ORDER — METOPROLOL TARTRATE 100 MG PO TABS: ORAL_TABLET | ORAL | 1 refills | Status: DC

## 2016-12-15 NOTE — Progress Notes (Signed)
BP (!) 148/84 (BP Location: Left Arm, Patient Position: Sitting, Cuff Size: Normal)   Pulse (!) 54   Temp 98 F (36.7 C)   Wt 211 lb 4 oz (95.8 kg)   SpO2 99%   BMI 28.65 kg/m    Subjective:    Patient ID: Michael Bonilla, male    DOB: 08-Sep-1955, 61 y.o.   MRN: [id number]  HPI: Michael Bonilla is a 61 y.o. male  Chief Complaint  Patient presents with  . Peripheral Neuropathy   NEUROPATHY- doing well off the lyrica. Has been feeling well on the current regimen and not as grumpy as he was on the lyrica Neuropathy status: controlled  Satisfied with current treatment?: yes Medication side effects: no Medication compliance:  excellent compliance Location: legs Pain: yes Severity: mild  Quality:  Pins and needles, electric pain Frequency: constant Bilateral: yes Symmetric: yes Numbness: yes Decreased sensation: no Weakness: yes Context: better  Relevant past medical, surgical, family and social history reviewed and updated as indicated. Interim medical history since our last visit reviewed. Allergies and medications reviewed and updated.  Review of Systems  Constitutional: Negative.   Respiratory: Negative.   Cardiovascular: Negative.   Musculoskeletal: Negative.   Neurological: Negative.   Psychiatric/Behavioral: Negative.     Per HPI unless specifically indicated above     Objective:    BP (!) 148/84 (BP Location: Left Arm, Patient Position: Sitting, Cuff Size: Normal)   Pulse (!) 54   Temp 98 F (36.7 C)   Wt 211 lb 4 oz (95.8 kg)   SpO2 99%   BMI 28.65 kg/m   Wt Readings from Last 3 Encounters:  12/15/16 211 lb 4 oz (95.8 kg)  09/23/16 217 lb 12.8 oz (98.8 kg)  07/28/16 205 lb (93 kg)    Physical Exam  Constitutional: He is oriented to person, place, and time. He appears well-developed and well-nourished. No distress.  HENT:  Head: Normocephalic and atraumatic.  Right Ear: Hearing normal.  Left Ear: Hearing normal.  Nose: Nose normal.    Eyes: Conjunctivae and lids are normal. Right eye exhibits no discharge. Left eye exhibits no discharge. No scleral icterus.  Cardiovascular: Normal rate, regular rhythm, normal heart sounds and intact distal pulses.  Exam reveals no gallop and no friction rub.   No murmur heard. Pulmonary/Chest: Effort normal and breath sounds normal. No respiratory distress. He has no wheezes. He has no rales. He exhibits no tenderness.  Musculoskeletal: Normal range of motion.  Neurological: He is alert and oriented to person, place, and time.  Skin: Skin is warm, dry and intact. No rash noted. He is not diaphoretic. No erythema. No pallor.  Psychiatric: He has a normal mood and affect. His speech is normal and behavior is normal. Judgment and thought content normal. Cognition and memory are normal.  Nursing note and vitals reviewed.   Results for orders placed or performed in visit on 07/28/16  Microscopic Examination  Result Value Ref Range   WBC, UA None seen 0 - 5 /hpf   RBC, UA None seen 0 - 2 /hpf   Epithelial Cells (non renal) CANCELED    Bacteria, UA None seen None seen/Few  CBC With Differential/Platelet  Result Value Ref Range   WBC 6.9 3.4 - 10.8 x10E3/uL   RBC 5.56 4.14 - 5.80 x10E6/uL   Hemoglobin 17.6 13.0 - 17.7 g/dL   Hematocrit 51.5 (H) 37.5 - 51.0 %   MCV 93 79 - 97 fL  MCH 31.7 26.6 - 33.0 pg   MCHC 34.2 31.5 - 35.7 g/dL   RDW 14.0 12.3 - 15.4 %   Platelets 120 (L) 150 - 379 x10E3/uL   Neutrophils 74 Not Estab. %   Lymphs 17 Not Estab. %   MID 9 Not Estab. %   Neutrophils Absolute 5.1 1.4 - 7.0 x10E3/uL   Lymphocytes Absolute 1.2 0.7 - 3.1 x10E3/uL   MID (Absolute) 0.6 0.1 - 1.6 X10E3/uL  Comprehensive metabolic panel  Result Value Ref Range   Glucose 106 (H) 65 - 99 mg/dL   BUN 12 8 - 27 mg/dL   Creatinine, Ser 1.16 0.76 - 1.27 mg/dL   GFR calc non Af Amer 67 >59 mL/min/1.73   GFR calc Af Amer 78 >59 mL/min/1.73   BUN/Creatinine Ratio 10 10 - 24   Sodium 140 134 -  144 mmol/L   Potassium 4.3 3.5 - 5.2 mmol/L   Chloride 99 96 - 106 mmol/L   CO2 26 18 - 29 mmol/L   Calcium 10.0 8.6 - 10.2 mg/dL   Total Protein 7.5 6.0 - 8.5 g/dL   Albumin 4.6 3.6 - 4.8 g/dL   Globulin, Total 2.9 1.5 - 4.5 g/dL   Albumin/Globulin Ratio 1.6 1.2 - 2.2   Bilirubin Total 0.6 0.0 - 1.2 mg/dL   Alkaline Phosphatase 88 39 - 117 IU/L   AST 98 (H) 0 - 40 IU/L   ALT 81 (H) 0 - 44 IU/L  Lipase  Result Value Ref Range   Lipase 72 13 - 78 U/L  Amylase  Result Value Ref Range   Amylase 45 31 - 124 U/L  UA/M w/rflx Culture, Routine  Result Value Ref Range   Specific Gravity, UA 1.020 1.005 - 1.030   pH, UA 7.0 5.0 - 7.5   Color, UA Yellow Yellow   Appearance Ur Clear Clear   Leukocytes, UA Negative Negative   Protein, UA 1+ (A) Negative/Trace   Glucose, UA Trace (A) Negative   Ketones, UA Negative Negative   RBC, UA Trace (A) Negative   Bilirubin, UA Negative Negative   Urobilinogen, Ur 0.2 0.2 - 1.0 mg/dL   Nitrite, UA Negative Negative   Microscopic Examination See below:       Assessment & Plan:   Problem List Items Addressed This Visit      Nervous and Auditory   Peripheral neuropathy - Primary    Doing very well off his lyrica. Under good control on the gabapentin. Continue current regimen. Continue to monitor. Call with any concerns.       Relevant Medications   gabapentin (NEURONTIN) 300 MG capsule       Follow up plan: Return After 03/27/17 for physical.

## 2016-12-15 NOTE — Assessment & Plan Note (Signed)
Doing very well off his lyrica. Under good control on the gabapentin. Continue current regimen. Continue to monitor. Call with any concerns.

## 2017-01-12 ENCOUNTER — Other Ambulatory Visit: Payer: Self-pay | Admitting: Family Medicine

## 2017-03-04 ENCOUNTER — Ambulatory Visit: Payer: BLUE CROSS/BLUE SHIELD | Admitting: Family Medicine

## 2017-03-04 ENCOUNTER — Encounter: Payer: Self-pay | Admitting: Family Medicine

## 2017-03-04 VITALS — BP 138/78 | HR 59 | Temp 97.9°F | Wt 214.4 lb

## 2017-03-04 DIAGNOSIS — E782 Mixed hyperlipidemia: Secondary | ICD-10-CM | POA: Diagnosis not present

## 2017-03-04 DIAGNOSIS — Z720 Tobacco use: Secondary | ICD-10-CM | POA: Diagnosis not present

## 2017-03-04 DIAGNOSIS — R972 Elevated prostate specific antigen [PSA]: Secondary | ICD-10-CM

## 2017-03-04 DIAGNOSIS — G6289 Other specified polyneuropathies: Secondary | ICD-10-CM

## 2017-03-04 DIAGNOSIS — K219 Gastro-esophageal reflux disease without esophagitis: Secondary | ICD-10-CM | POA: Diagnosis not present

## 2017-03-04 DIAGNOSIS — Z113 Encounter for screening for infections with a predominantly sexual mode of transmission: Secondary | ICD-10-CM

## 2017-03-04 DIAGNOSIS — R21 Rash and other nonspecific skin eruption: Secondary | ICD-10-CM | POA: Diagnosis not present

## 2017-03-04 DIAGNOSIS — I129 Hypertensive chronic kidney disease with stage 1 through stage 4 chronic kidney disease, or unspecified chronic kidney disease: Secondary | ICD-10-CM | POA: Diagnosis not present

## 2017-03-04 LAB — UA/M W/RFLX CULTURE, ROUTINE
Bilirubin, UA: NEGATIVE
KETONES UA: NEGATIVE
LEUKOCYTES UA: NEGATIVE
Nitrite, UA: NEGATIVE
Protein, UA: NEGATIVE
SPEC GRAV UA: 1.01 (ref 1.005–1.030)
Urobilinogen, Ur: 0.2 mg/dL (ref 0.2–1.0)
pH, UA: 7 (ref 5.0–7.5)

## 2017-03-04 LAB — MICROSCOPIC EXAMINATION

## 2017-03-04 LAB — MICROALBUMIN, URINE WAIVED
Creatinine, Urine Waived: 50 mg/dL (ref 10–300)
MICROALB, UR WAIVED: 10 mg/L (ref 0–19)

## 2017-03-04 MED ORDER — MUPIROCIN CALCIUM 2 % EX CREA: 1.0000 "application " | TOPICAL_CREAM | Freq: Two times a day (BID) | CUTANEOUS | 0 refills | Status: DC

## 2017-03-04 MED ORDER — VARENICLINE TARTRATE 1 MG PO TABS: 1.0000 mg | ORAL_TABLET | Freq: Two times a day (BID) | ORAL | 1 refills | Status: DC

## 2017-03-04 NOTE — Assessment & Plan Note (Signed)
Doing great! Cutting down. Refill of chantix given today.

## 2017-03-04 NOTE — Progress Notes (Signed)
BP 138/78 (BP Location: Left Arm, Patient Position: Sitting, Cuff Size: Normal)   Pulse (!) 59   Temp 97.9 F (36.6 C)   Wt 214 lb 7 oz (97.3 kg)   SpO2 99%   BMI 29.08 kg/m    Subjective:    Patient ID: Michael Bonilla, male    DOB: 1955/05/24, 61 y.o.   MRN: [id number]  HPI: Michael Bonilla is a 61 y.o. male  Chief Complaint  Patient presents with  . Rash    groin area  . Nicotine Dependence    Refill of Chantix   SMOKING CESSATION Smoking Status: current every day smoker Smoking Amount: 1 cig/day Smoking Quit Date: not set Smoking triggers: eating, driving Type of tobacco use: cigarettes Children in the house: no Other household members who smoke: no Treatments attempted: chantix Pneumovax: up to date  RASH- was using nystatin in the hot months and it would help, but now seems to be looking different and in different spots Duration:  About a month  Location: penis and scrotum  Itching: no Burning: no Redness: no Oozing: no Scaling: no Blisters: no Painful: no Fevers: no Change in detergents/soaps/personal care products: no Recent illness: no Recent travel:no History of same: no Context: better Alleviating factors: nothing Treatments attempted:nystatin Shortness of breath: no  Throat/tongue swelling: no Myalgias/arthralgias: no   Relevant past medical, surgical, family and social history reviewed and updated as indicated. Interim medical history since our last visit reviewed. Allergies and medications reviewed and updated.  Review of Systems  Constitutional: Negative.   Respiratory: Negative.   Cardiovascular: Negative.   Genitourinary: Negative.   Skin: Positive for rash. Negative for color change, pallor and wound.  Psychiatric/Behavioral: Negative.     Per HPI unless specifically indicated above     Objective:    BP 138/78 (BP Location: Left Arm, Patient Position: Sitting, Cuff Size: Normal)   Pulse (!) 59   Temp 97.9 F (36.6 C)    Wt 214 lb 7 oz (97.3 kg)   SpO2 99%   BMI 29.08 kg/m   Wt Readings from Last 3 Encounters:  03/04/17 214 lb 7 oz (97.3 kg)  12/15/16 211 lb 4 oz (95.8 kg)  09/23/16 217 lb 12.8 oz (98.8 kg)    Physical Exam  Constitutional: He is oriented to person, place, and time. He appears well-developed and well-nourished. No distress.  HENT:  Head: Normocephalic and atraumatic.  Right Ear: Hearing normal.  Left Ear: Hearing normal.  Nose: Nose normal.  Eyes: Conjunctivae and lids are normal. Right eye exhibits no discharge. Left eye exhibits no discharge. No scleral icterus.  Cardiovascular: Normal rate, regular rhythm and intact distal pulses. Exam reveals no gallop and no friction rub.  No murmur heard. Pulmonary/Chest: Effort normal. No respiratory distress. He has no wheezes. He has no rales. He exhibits no tenderness.  Musculoskeletal: Normal range of motion.  Neurological: He is alert and oriented to person, place, and time.  Skin: Skin is warm, dry and intact. Rash noted. He is not diaphoretic. No erythema. No pallor.  Flat, dry appearing rash on head of the penis only- nothing elsewhere. No vesicles  Psychiatric: He has a normal mood and affect. His speech is normal and behavior is normal. Judgment and thought content normal. Cognition and memory are normal.    Results for orders placed or performed in visit on 07/28/16  Microscopic Examination  Result Value Ref Range   WBC, UA None seen 0 - 5 /hpf  RBC, UA None seen 0 - 2 /hpf   Epithelial Cells (non renal) CANCELED    Bacteria, UA None seen None seen/Few  CBC With Differential/Platelet  Result Value Ref Range   WBC 6.9 3.4 - 10.8 x10E3/uL   RBC 5.56 4.14 - 5.80 x10E6/uL   Hemoglobin 17.6 13.0 - 17.7 g/dL   Hematocrit 51.5 (H) 37.5 - 51.0 %   MCV 93 79 - 97 fL   MCH 31.7 26.6 - 33.0 pg   MCHC 34.2 31.5 - 35.7 g/dL   RDW 14.0 12.3 - 15.4 %   Platelets 120 (L) 150 - 379 x10E3/uL   Neutrophils 74 Not Estab. %   Lymphs 17  Not Estab. %   MID 9 Not Estab. %   Neutrophils Absolute 5.1 1.4 - 7.0 x10E3/uL   Lymphocytes Absolute 1.2 0.7 - 3.1 x10E3/uL   MID (Absolute) 0.6 0.1 - 1.6 X10E3/uL  Comprehensive metabolic panel  Result Value Ref Range   Glucose 106 (H) 65 - 99 mg/dL   BUN 12 8 - 27 mg/dL   Creatinine, Ser 1.16 0.76 - 1.27 mg/dL   GFR calc non Af Amer 67 >59 mL/min/1.73   GFR calc Af Amer 78 >59 mL/min/1.73   BUN/Creatinine Ratio 10 10 - 24   Sodium 140 134 - 144 mmol/L   Potassium 4.3 3.5 - 5.2 mmol/L   Chloride 99 96 - 106 mmol/L   CO2 26 18 - 29 mmol/L   Calcium 10.0 8.6 - 10.2 mg/dL   Total Protein 7.5 6.0 - 8.5 g/dL   Albumin 4.6 3.6 - 4.8 g/dL   Globulin, Total 2.9 1.5 - 4.5 g/dL   Albumin/Globulin Ratio 1.6 1.2 - 2.2   Bilirubin Total 0.6 0.0 - 1.2 mg/dL   Alkaline Phosphatase 88 39 - 117 IU/L   AST 98 (H) 0 - 40 IU/L   ALT 81 (H) 0 - 44 IU/L  Lipase  Result Value Ref Range   Lipase 72 13 - 78 U/L  Amylase  Result Value Ref Range   Amylase 45 31 - 124 U/L  UA/M w/rflx Culture, Routine  Result Value Ref Range   Specific Gravity, UA 1.020 1.005 - 1.030   pH, UA 7.0 5.0 - 7.5   Color, UA Yellow Yellow   Appearance Ur Clear Clear   Leukocytes, UA Negative Negative   Protein, UA 1+ (A) Negative/Trace   Glucose, UA Trace (A) Negative   Ketones, UA Negative Negative   RBC, UA Trace (A) Negative   Bilirubin, UA Negative Negative   Urobilinogen, Ur 0.2 0.2 - 1.0 mg/dL   Nitrite, UA Negative Negative   Microscopic Examination See below:       Assessment & Plan:   Problem List Items Addressed This Visit      Digestive   GERD (gastroesophageal reflux disease)   Relevant Orders   Comprehensive metabolic panel   CBC with Differential/Platelet     Nervous and Auditory   Peripheral neuropathy   Relevant Medications   varenicline (CHANTIX CONTINUING MONTH PAK) 1 MG tablet   Other Relevant Orders   Comprehensive metabolic panel   TSH     Genitourinary   Hypertensive CKD  (chronic kidney disease)   Relevant Orders   Comprehensive metabolic panel   Microalbumin, Urine Waived   TSH     Other   Hyperlipidemia   Relevant Orders   Comprehensive metabolic panel   Lipid Panel w/o Chol/HDL Ratio   Elevated prostate specific antigen (PSA)  Relevant Orders   Comprehensive metabolic panel   PSA   Tobacco abuse    Doing great! Cutting down. Refill of chantix given today.       Other Visit Diagnoses    Penile rash    -  Primary   Will treat with bactroban and check for STIs. Await results. Call with any concerns.    Relevant Orders   UA/M w/rflx Culture, Routine   HIV antibody   RPR   HSV(herpes simplex vrs) 1+2 ab-IgG   Hepatitis, Acute   GC/Chlamydia Probe Amp   Routine screening for STI (sexually transmitted infection)       Labs drawn today, await results.    Relevant Orders   UA/M w/rflx Culture, Routine   HIV antibody   RPR   HSV(herpes simplex vrs) 1+2 ab-IgG   Hepatitis, Acute   GC/Chlamydia Probe Amp     Due for physical next month. Labs drawn today to save him a stick- majority of these diagnoses not discussed today.  Follow up plan: Return As scheduled.

## 2017-03-05 LAB — HSV(HERPES SIMPLEX VRS) I + II AB-IGG: HSV 1 Glycoprotein G Ab, IgG: 2.44 index — ABNORMAL HIGH (ref 0.00–0.90)

## 2017-03-05 LAB — CBC WITH DIFFERENTIAL/PLATELET
BASOS: 1 %
Basophils Absolute: 0.1 10*3/uL (ref 0.0–0.2)
EOS (ABSOLUTE): 0.1 10*3/uL (ref 0.0–0.4)
EOS: 1 %
Hematocrit: 45.2 % (ref 37.5–51.0)
Hemoglobin: 15.6 g/dL (ref 13.0–17.7)
IMMATURE GRANS (ABS): 0 10*3/uL (ref 0.0–0.1)
IMMATURE GRANULOCYTES: 0 %
LYMPHS: 17 %
Lymphocytes Absolute: 1.3 10*3/uL (ref 0.7–3.1)
MCH: 32.3 pg (ref 26.6–33.0)
MCHC: 34.5 g/dL (ref 31.5–35.7)
MCV: 94 fL (ref 79–97)
Monocytes Absolute: 0.7 10*3/uL (ref 0.1–0.9)
Monocytes: 10 %
NEUTROS PCT: 71 %
Neutrophils Absolute: 5.2 10*3/uL (ref 1.4–7.0)
PLATELETS: 119 10*3/uL — AB (ref 150–379)
RBC: 4.83 x10E6/uL (ref 4.14–5.80)
RDW: 13.5 % (ref 12.3–15.4)
WBC: 7.4 10*3/uL (ref 3.4–10.8)

## 2017-03-05 LAB — COMPREHENSIVE METABOLIC PANEL
ALK PHOS: 85 IU/L (ref 39–117)
ALT: 70 IU/L — ABNORMAL HIGH (ref 0–44)
AST: 77 IU/L — AB (ref 0–40)
Albumin/Globulin Ratio: 1.8 (ref 1.2–2.2)
Albumin: 4.4 g/dL (ref 3.6–4.8)
BILIRUBIN TOTAL: 0.4 mg/dL (ref 0.0–1.2)
BUN/Creatinine Ratio: 9 — ABNORMAL LOW (ref 10–24)
BUN: 9 mg/dL (ref 8–27)
CO2: 24 mmol/L (ref 20–29)
CREATININE: 0.95 mg/dL (ref 0.76–1.27)
Calcium: 9.2 mg/dL (ref 8.6–10.2)
Chloride: 104 mmol/L (ref 96–106)
GFR calc Af Amer: 98 mL/min/{1.73_m2} (ref >59)
GFR, EST NON AFRICAN AMERICAN: 85 mL/min/{1.73_m2} (ref >59)
GLOBULIN, TOTAL: 2.4 g/dL (ref 1.5–4.5)
GLUCOSE: 80 mg/dL (ref 65–99)
Potassium: 4.7 mmol/L (ref 3.5–5.2)
Sodium: 143 mmol/L (ref 134–144)
Total Protein: 6.8 g/dL (ref 6.0–8.5)

## 2017-03-05 LAB — HEPATITIS PANEL, ACUTE
HEP A IGM: NEGATIVE
HEP B C IGM: NEGATIVE
HEP B S AG: NEGATIVE

## 2017-03-05 LAB — LIPID PANEL W/O CHOL/HDL RATIO
Cholesterol, Total: 151 mg/dL (ref 100–199)
HDL: 43 mg/dL (ref >39)
LDL Calculated: 69 mg/dL (ref 0–99)
TRIGLYCERIDES: 196 mg/dL — AB (ref 0–149)
VLDL CHOLESTEROL CAL: 39 mg/dL (ref 5–40)

## 2017-03-05 LAB — GC/CHLAMYDIA PROBE AMP
Chlamydia trachomatis, NAA: NEGATIVE
Neisseria gonorrhoeae by PCR: NEGATIVE

## 2017-03-05 LAB — HIV ANTIBODY (ROUTINE TESTING W REFLEX): HIV Screen 4th Generation wRfx: NONREACTIVE

## 2017-03-05 LAB — PSA: Prostate Specific Ag, Serum: 11.7 ng/mL — ABNORMAL HIGH (ref 0.0–4.0)

## 2017-03-05 LAB — RPR: RPR Ser Ql: NONREACTIVE

## 2017-03-05 LAB — TSH: TSH: 1.44 u[IU]/mL (ref 0.450–4.500)

## 2017-03-10 ENCOUNTER — Telehealth: Payer: Self-pay | Admitting: Family Medicine

## 2017-03-10 DIAGNOSIS — R972 Elevated prostate specific antigen [PSA]: Secondary | ICD-10-CM

## 2017-03-10 NOTE — Telephone Encounter (Signed)
Called to let him know that his labs were normal except an elevated PSA- will recheck 1 month, if still high will send to urology.

## 2017-03-30 ENCOUNTER — Encounter: Payer: BLUE CROSS/BLUE SHIELD | Admitting: Family Medicine

## 2017-04-07 ENCOUNTER — Other Ambulatory Visit: Payer: Self-pay | Admitting: Family Medicine

## 2017-05-14 ENCOUNTER — Other Ambulatory Visit: Payer: Self-pay | Admitting: Family Medicine

## 2017-05-17 ENCOUNTER — Other Ambulatory Visit: Payer: Self-pay | Admitting: Family Medicine

## 2017-05-29 ENCOUNTER — Telehealth: Payer: Self-pay | Admitting: Family Medicine

## 2017-05-29 NOTE — Telephone Encounter (Signed)
I've got a coupon up front for him. [facility]12 at [facility].

## 2017-05-29 NOTE — Telephone Encounter (Signed)
Copied from [place]. Topic: Quick Communication - See Telephone Encounter >> May 29, 2017 10:35 AM Cleaster Corin, NT wrote: CRM for notification. See Telephone encounter for:   05/29/17. Pt. Calling to request a different rx. For mupirocin cream due to cream being to expensive. Went to pick up at pharmacy and it was over $200. Pt. Requesting if something similar can be called in. Pt. Can be reached at [phone]  [id number] [place], [place] - [address] [place] [address] Phone: [phone] Fax: [phone]

## 2017-05-29 NOTE — Telephone Encounter (Signed)
Patient notified

## 2017-06-08 ENCOUNTER — Other Ambulatory Visit: Payer: Self-pay | Admitting: Family Medicine

## 2017-06-21 ENCOUNTER — Other Ambulatory Visit: Payer: Self-pay | Admitting: Family Medicine

## 2017-08-11 ENCOUNTER — Other Ambulatory Visit: Payer: Self-pay | Admitting: Family Medicine

## 2017-09-01 ENCOUNTER — Other Ambulatory Visit: Payer: Self-pay | Admitting: Unknown Physician Specialty

## 2017-09-02 NOTE — Telephone Encounter (Signed)
LOV  03/04/17 Dr. Wynetta Emery Last refill 03/27/16  #53  With 0 refill

## 2017-09-12 ENCOUNTER — Other Ambulatory Visit: Payer: Self-pay | Admitting: Family Medicine

## 2017-09-16 NOTE — Telephone Encounter (Signed)
omeprazole refill Last Refill:05/14/17 #90 caps 1 RF Last OV: 03/04/17 PCP: Park Liter DO [facility] [address]

## 2017-11-03 ENCOUNTER — Other Ambulatory Visit: Payer: Self-pay | Admitting: Family Medicine

## 2017-12-02 ENCOUNTER — Other Ambulatory Visit: Payer: Self-pay | Admitting: Unknown Physician Specialty

## 2017-12-02 NOTE — Telephone Encounter (Signed)
Chantix refill Last Refill:11/03/17 # 56/1 refill Last OV: 11/14/1/ PCP: [place]: [id number] - [place], [place] [phone] (Phone) [phone] (Fax)

## 2017-12-08 ENCOUNTER — Other Ambulatory Visit: Payer: Self-pay | Admitting: Family Medicine

## 2017-12-08 NOTE — Telephone Encounter (Signed)
gabapentin refill Last Refill:09/12/17 # 270 Last OV:03/04/17 PCP: Dr Park Liter [facility] [address] [place] [place]

## 2017-12-29 ENCOUNTER — Other Ambulatory Visit: Payer: Self-pay

## 2017-12-29 ENCOUNTER — Other Ambulatory Visit: Payer: Self-pay | Admitting: Unknown Physician Specialty

## 2017-12-29 ENCOUNTER — Ambulatory Visit
Admission: EM | Admit: 2017-12-29 | Discharge: 2017-12-29 | Disposition: A | Discharge status: Home / Self Care | Payer: BLUE CROSS/BLUE SHIELD | Attending: Family Medicine | Admitting: Family Medicine

## 2017-12-29 ENCOUNTER — Encounter: Payer: Self-pay | Admitting: Emergency Medicine

## 2017-12-29 DIAGNOSIS — S61412A Laceration without foreign body of left hand, initial encounter: Secondary | ICD-10-CM

## 2017-12-29 DIAGNOSIS — W268XXA Contact with other sharp object(s), not elsewhere classified, initial encounter: Secondary | ICD-10-CM | POA: Diagnosis not present

## 2017-12-29 DIAGNOSIS — S61419A Laceration without foreign body of unspecified hand, initial encounter: Secondary | ICD-10-CM

## 2017-12-29 NOTE — Telephone Encounter (Signed)
chantix refill Last Refill:12/06/17 # 56 Last OV: 03/04/17 PCP: Dr Park Liter [facility] [address]

## 2017-12-29 NOTE — ED Provider Notes (Signed)
[facility]    CSN: [id number] Arrival date & time: 12/29/17  1524     History   Chief Complaint Chief Complaint  Patient presents with  . Hand Injury    HPI Michael Bonilla is a 62 y.o. male.   HPI  62 year old male was attempting to removing actuator from a car door when his left hand to hit on a piece of metal of the car door.  This is scraped the dorsum of his left nondominant hand.  It bled for quite a long time so he came to our facility.  By the time he reached here the bleeding had been controlled by the patient.  States that he is current on his tetanus toxoid.         Past Medical History:  Diagnosis Date  . CAD (coronary artery disease)   . Cancer [facility])    Colon  . Hyperlipidemia   . Hypertension   . Myocardial infarction ([place])   . Stroke [facility])     Patient Active Problem List   Diagnosis Date Noted  . Tobacco abuse 03/04/2017  . CAD (coronary artery disease) 12/21/2014  . Cancer of colon ([place]) 12/21/2014  . Hyperlipidemia 12/21/2014  . Hypertensive CKD (chronic kidney disease) 12/21/2014  . Peripheral neuropathy 12/21/2014  . GERD (gastroesophageal reflux disease) 12/21/2014  . CKD (chronic kidney disease), stage I 12/21/2014  . Elevated prostate specific antigen (PSA) 12/21/2014    Past Surgical History:  Procedure Laterality Date  . COLON SURGERY  2012   Partial colectomy   . cornary angioplasty   12/08/2005  . HERNIA REPAIR    . PROSTATE SURGERY  2012   Negative Prostate biopsy       Home Medications    Prior to Admission medications   Medication Sig Start Date End Date Taking? Authorizing Provider  aspirin 81 MG tablet Take 81 mg by mouth daily.   Yes [provider]  gabapentin (NEURONTIN) 300 MG capsule TAKE 1 CAPSULE BY MOUTH THREE TIMES A DAY 12/08/17 01/07/18 Yes Pollak, Adriana M, PA-C  metoprolol tartrate (LOPRESSOR) 100 MG tablet TAKE 1 TABLET BY MOUTH TWICE A DAY 08/11/17  Yes Johnson, Megan P, DO    nitroGLYCERIN (NITROSTAT) 0.6 MG SL tablet Place 1 tablet (0.6 mg total) under the tongue every 5 (five) minutes as needed for chest pain. 12/21/14  Yes Johnson, Megan P, DO  Omega-3 Fatty Acids (FISH OIL) 1000 MG CAPS Take 1,000 mg by mouth daily.   Yes [provider]  omeprazole (PRILOSEC) 40 MG capsule TAKE 1 CAPSULE (40 MG TOTAL) BY MOUTH DAILY. 09/16/17  Yes Johnson, Megan P, DO  simvastatin (ZOCOR) 40 MG tablet TAKE 1 TABLET (40 MG TOTAL) BY MOUTH DAILY. 12/08/17  Yes Valerie Roys, DO    Family History Family History  Problem Relation Age of Onset  . Cancer Mother        brain  . Heart disease Father     Social History Social History   Tobacco Use  . Smoking status: Current Every Day Smoker    Packs/day: 0.10    Types: Cigarettes  . Smokeless tobacco: Never Used  Substance Use Topics  . Alcohol use: Yes    Alcohol/week: 12.0 standard drinks    Types: 12 Cans of beer per week  . Drug use: No     Allergies   Patient has no known allergies.   Review of Systems Review of Systems  Constitutional: Positive for activity change.  Negative for appetite change, chills, fatigue and fever.  Skin: Positive for wound.  All other systems reviewed and are negative.    Physical Exam Triage Vital Signs ED Triage Vitals  Enc Vitals Group     BP 12/29/17 1545 (!) 152/79     Pulse Rate 12/29/17 1545 75     Resp 12/29/17 1545 18     Temp 12/29/17 1545 98.2 F (36.8 C)     Temp Source 12/29/17 1545 Oral     SpO2 12/29/17 1545 97 %     Weight 12/29/17 1543 200 lb (90.7 kg)     Height 12/29/17 1543 6' (1.829 m)     Head Circumference --      Peak Flow --      Pain Score 12/29/17 1543 3     Pain Loc --      Pain Edu? --      Excl. in [place]? --    No data found.  Updated Vital Signs BP (!) 152/79 (BP Location: Right Arm)   Pulse 75   Temp 98.2 F (36.8 C) (Oral)   Resp 18   Ht 6' (1.829 m)   Wt 200 lb (90.7 kg)   SpO2 97%   BMI 27.12 kg/m   Visual  Acuity Right Eye Distance:   Left Eye Distance:   Bilateral Distance:    Right Eye Near:   Left Eye Near:    Bilateral Near:     Physical Exam  Constitutional: He is oriented to person, place, and time. He appears well-developed and well-nourished. No distress.  HENT:  Head: Normocephalic.  Eyes: Pupils are equal, round, and reactive to light.  Neck: Normal range of motion.  Musculoskeletal: Normal range of motion. He exhibits tenderness.  Neurological: He is alert and oriented to person, place, and time.  Skin: Skin is warm and dry. He is not diaphoretic.  Exam dorsum of the left nondominant hand shows a divot type laceration over the dorsum.  It measures 5 mm on its base gradually tapering to a point.  Is a skin tear.  Bleeding has stopped.  Psychiatric: He has a normal mood and affect. His behavior is normal. Judgment and thought content normal.  Nursing note and vitals reviewed.    UC Treatments / Results  Labs (all labs ordered are listed, but only abnormal results are displayed) Labs Reviewed - No data to display  EKG None  Radiology No results found.  Procedures After examining the patient and describing the procedure to him, he was agreeable. Prior  To the procedure, the patient had the wound cleansed.  Utilizing an aseptic technique, the skin tear was unfurled to cover as much of the wound is possible.  After this had been accomplished, Dermabond skin adhesive was then utilized to tack the skin to the surface of the hand.  Allowed to dry.  He was fully instructed in signs and symptoms of infection.  Return to our clinic if any these occur.  Medications Ordered in UC Medications - No data to display  Initial Impression / Assessment and Plan / UC Course  I have reviewed the triage vital signs and the nursing notes.  Pertinent labs & imaging results that were available during my care of the patient were reviewed by me and considered in my medical decision making (see  chart for details).     She will keep the area dry for 24 hours.  Not to lift the edges of the Dermabond and  allow to drop off on its own.  Any signs or symptoms of infection occur the patient will return to our clinic Final Clinical Impressions(s) / UC Diagnoses   Final diagnoses:  Skin tear of hand without complication, initial encounter     Discharge Instructions     Keep dry for 24 hours. Do not Pick at the glue let it fall off on its own.  Watch out for signs and symptoms of infection and if any these occur return to our clinic immediately   ED Prescriptions    None     Controlled Substance Prescriptions [place] Controlled Substance Registry consulted? Not Applicable   Corrado, Hymon, PA-C 12/29/17 1657

## 2017-12-29 NOTE — Discharge Instructions (Signed)
Keep dry for 24 hours. Do not Pick at the glue let it fall off on its own.  Watch out for signs and symptoms of infection and if any these occur return to our clinic immediately

## 2017-12-29 NOTE — ED Triage Notes (Signed)
Patient c/o hitting his left hand on a metal piece of the car door. Patient has wound to the top of his hand. Bleeding was controlled by patient.

## 2017-12-31 ENCOUNTER — Other Ambulatory Visit: Payer: Self-pay | Admitting: Physician Assistant

## 2018-02-08 ENCOUNTER — Other Ambulatory Visit: Payer: Self-pay | Admitting: Family Medicine

## 2018-02-08 NOTE — Telephone Encounter (Signed)
Requested medication (s) are due for refill today: Yes  Requested medication (s) are on the active medication list: Yes  Last refill:  08/11/17  Future visit scheduled: No  Notes to clinic:  Unable to refill, needs CPE, abnormal BP     Requested Prescriptions  Pending Prescriptions Disp Refills   metoprolol tartrate (LOPRESSOR) 100 MG tablet [Pharmacy Med Name: METOPROLOL TARTRATE 100 MG TAB] 180 tablet 1    Sig: TAKE 1 TABLET BY MOUTH TWICE A DAY     Cardiovascular:  Beta Blockers Failed - 02/08/2018  5:36 PM      Failed - Last BP in normal range    BP Readings from Last 1 Encounters:  12/29/17 (!) 152/79         Failed - Valid encounter within last 6 months    Recent Outpatient Visits          11 months ago Penile rash   [place], [place], DO   1 year ago Other polyneuropathy   [facility], [place], DO   1 year ago Varicose vein of leg   [facility] Kathrine Haddock, NP   1 year ago Fever, unspecified fever cause   [place], [place], DO   1 year ago Viral URI   [place], [place], [place]             Passed - Last Heart Rate in normal range    Pulse Readings from Last 1 Encounters:  12/29/17 75

## 2018-02-08 NOTE — Telephone Encounter (Signed)
Patient called, unable to leave VM, no answer/busy. Patient will need a physical appointment scheduled, last OV 03/04/17.

## 2018-02-09 NOTE — Telephone Encounter (Signed)
Needs follow up appointment.  

## 2018-02-10 NOTE — Telephone Encounter (Signed)
Needs a follow up appointment.

## 2018-02-11 ENCOUNTER — Encounter: Payer: Self-pay | Admitting: Family Medicine

## 2018-02-11 NOTE — Telephone Encounter (Signed)
Needs follow up appointment. Is not going to get refill until booked,  Then I will refill to get to the appointment.

## 2018-02-11 NOTE — Telephone Encounter (Signed)
Tried to call home and cell number but neither numbers work. Printed letter to mail.

## 2018-03-03 ENCOUNTER — Other Ambulatory Visit: Payer: Self-pay | Admitting: Family Medicine

## 2018-03-03 ENCOUNTER — Ambulatory Visit: Payer: Self-pay | Admitting: *Deleted

## 2018-03-03 NOTE — Telephone Encounter (Signed)
  Patient is calling to report he has had LLQ pain for 2 weeks. It has not improved and he has no other symptoms- no fever,diarrhea, or constipation . Pain comes and goes and is usually worse in the morning. Reason for Disposition . [1] MODERATE pain (e.g., interferes with normal activities) AND [2] pain comes and goes (cramps) AND [3] present > 24 hours  (Exception: pain with Vomiting or Diarrhea - see that Guideline)  Answer Assessment - Initial Assessment Questions 1. LOCATION: "Where does it hurt?"      LLQ 2. RADIATION: "Does the pain shoot anywhere else?" (e.g., chest, back)     no 3. ONSET: "When did the pain begin?" (Minutes, hours or days ago)      2 weeks ago 4. SUDDEN: "Gradual or sudden onset?"     Gradual- then eases up 5. PATTERN "Does the pain come and go, or is it constant?"    - If constant: "Is it getting better, staying the same, or worsening?"      (Note: Constant means the pain never goes away completely; most serious pain is constant and it progresses)     - If intermittent: "How long does it last?" "Do you have pain now?"     (Note: Intermittent means the pain goes away completely between bouts)     Comes and goes- notices more in am- earlier today- subsided now- 3-5 minutes 6. SEVERITY: "How bad is the pain?"  (e.g., Scale 1-10; mild, moderate, or severe)    - MILD (1-3): doesn't interfere with normal activities, abdomen soft and not tender to touch     - MODERATE (4-7): interferes with normal activities or awakens from sleep, tender to touch     - SEVERE (8-10): excruciating pain, doubled over, unable to do any normal activities       6-7 - worst, subsides- 2 7. RECURRENT SYMPTOM: "Have you ever had this type of abdominal pain before?" If so, ask: "When was the last time?" and "What happened that time?"      no 8. CAUSE: "What do you think is causing the abdominal pain?"     Thinks GI 9. RELIEVING/AGGRAVATING FACTORS: "What makes it better or worse?" (e.g.,  movement, antacids, bowel movement)     Not really 10. OTHER SYMPTOMS: "Has there been any vomiting, diarrhea, constipation, or urine problems?"       nothing  Protocols used: ABDOMINAL PAIN - MALE-A-AH

## 2018-03-04 ENCOUNTER — Telehealth: Payer: Self-pay | Admitting: Family Medicine

## 2018-03-04 ENCOUNTER — Encounter: Payer: Self-pay | Admitting: Family Medicine

## 2018-03-04 ENCOUNTER — Ambulatory Visit: Payer: BLUE CROSS/BLUE SHIELD | Admitting: Family Medicine

## 2018-03-04 ENCOUNTER — Ambulatory Visit: Payer: BLUE CROSS/BLUE SHIELD

## 2018-03-04 VITALS — BP 153/91 | HR 85 | Temp 98.2°F | Ht 72.0 in | Wt 203.0 lb

## 2018-03-04 DIAGNOSIS — C187 Malignant neoplasm of sigmoid colon: Secondary | ICD-10-CM | POA: Diagnosis not present

## 2018-03-04 DIAGNOSIS — R1032 Left lower quadrant pain: Secondary | ICD-10-CM

## 2018-03-04 LAB — CBC WITH DIFFERENTIAL/PLATELET
HEMATOCRIT: 48.5 % (ref 37.5–51.0)
HEMOGLOBIN: 17.2 g/dL (ref 13.0–17.7)
Lymphocytes Absolute: 1.4 10*3/uL (ref 0.7–3.1)
Lymphs: 19 %
MCH: 33.7 pg — ABNORMAL HIGH (ref 26.6–33.0)
MCHC: 35.5 g/dL (ref 31.5–35.7)
MCV: 95 fL (ref 79–97)
MID (Absolute): 0.7 10*3/uL (ref 0.1–1.6)
MID: 9 %
NEUTROS PCT: 71 %
Neutrophils Absolute: 4.9 10*3/uL (ref 1.4–7.0)
Platelets: 116 10*3/uL — ABNORMAL LOW (ref 150–450)
RBC: 5.1 x10E6/uL (ref 4.14–5.80)
RDW: 13.1 % (ref 12.3–15.4)
WBC: 7 10*3/uL (ref 3.4–10.8)

## 2018-03-04 LAB — UA/M W/RFLX CULTURE, ROUTINE
Bilirubin, UA: NEGATIVE
Glucose, UA: NEGATIVE
Ketones, UA: NEGATIVE
LEUKOCYTES UA: NEGATIVE
NITRITE UA: NEGATIVE
PH UA: 6.5 (ref 5.0–7.5)
RBC UA: NEGATIVE
SPEC GRAV UA: 1.015 (ref 1.005–1.030)
Urobilinogen, Ur: 0.2 mg/dL (ref 0.2–1.0)

## 2018-03-04 MED ORDER — CIPROFLOXACIN HCL 500 MG PO TABS: 500.0000 mg | ORAL_TABLET | Freq: Two times a day (BID) | ORAL | 0 refills | Status: DC

## 2018-03-04 MED ORDER — METRONIDAZOLE 500 MG PO TABS: 500.0000 mg | ORAL_TABLET | Freq: Two times a day (BID) | ORAL | 0 refills | Status: DC

## 2018-03-04 NOTE — Assessment & Plan Note (Signed)
Now with LLQ, likely diverticulitis, but will check CT abdomen. Await results.

## 2018-03-04 NOTE — Progress Notes (Signed)
BP (!) 153/91 (BP Location: Left Arm, Patient Position: Sitting, Cuff Size: Normal)   Pulse 85   Temp 98.2 F (36.8 C) (Oral)   Ht 6' (1.829 m)   Wt 203 lb (92.1 kg)   SpO2 100%   BMI 27.53 kg/m    Subjective:    Patient ID: Michael Bonilla, male    DOB: 22-May-1955, 62 y.o.   MRN: [id number]  HPI: Michael Bonilla is a 62 y.o. male  Chief Complaint  Patient presents with  . Abdominal Pain    Ongoing for about 2-3 weeks. Lower left,    ABDOMINAL PAIN  Duration: 2-3 weeks Onset: gradual Severity: moderate Quality: aching and sore Location:  LLQ  Episode duration:  Radiation: yes into his groin Frequency: worse in the AM when he wakes up Alleviating factors: nothing Aggravating factors: nothing Status: fluctuating Treatments attempted: laxative Fever: yes Nausea: no Vomiting: no Weight loss: no Decreased appetite: no Diarrhea: no Constipation: yes Blood in stool: no Heartburn: no Jaundice: no Rash: no Dysuria/urinary frequency: no Hematuria: no History of sexually transmitted disease: no Recurrent NSAID use: no  Relevant past medical, surgical, family and social history reviewed and updated as indicated. Interim medical history since our last visit reviewed. Allergies and medications reviewed and updated.  Review of Systems  Constitutional: Positive for fever. Negative for activity change, appetite change, chills, diaphoresis, fatigue and unexpected weight change.  Respiratory: Negative.   Cardiovascular: Negative.   Gastrointestinal: Positive for abdominal pain and constipation. Negative for abdominal distention, anal bleeding, blood in stool, diarrhea, nausea, rectal pain and vomiting.  Genitourinary: Negative.   Musculoskeletal: Negative.   Skin: Negative.   Psychiatric/Behavioral: Negative.     Per HPI unless specifically indicated above     Objective:    BP (!) 153/91 (BP Location: Left Arm, Patient Position: Sitting, Cuff Size: Normal)    Pulse 85   Temp 98.2 F (36.8 C) (Oral)   Ht 6' (1.829 m)   Wt 203 lb (92.1 kg)   SpO2 100%   BMI 27.53 kg/m   Wt Readings from Last 3 Encounters:  03/04/18 203 lb (92.1 kg)  12/29/17 200 lb (90.7 kg)  03/04/17 214 lb 7 oz (97.3 kg)    Physical Exam  Constitutional: He is oriented to person, place, and time. He appears well-developed and well-nourished. No distress.  HENT:  Head: Normocephalic and atraumatic.  Right Ear: Hearing normal.  Left Ear: Hearing normal.  Nose: Nose normal.  Eyes: Conjunctivae and lids are normal. Right eye exhibits no discharge. Left eye exhibits no discharge. No scleral icterus.  Cardiovascular: Normal rate, regular rhythm, normal heart sounds and intact distal pulses. Exam reveals no gallop and no friction rub.  No murmur heard. Pulmonary/Chest: Effort normal and breath sounds normal. No stridor. No respiratory distress. He has no wheezes. He has no rhonchi. He has no rales. He exhibits no tenderness.  Abdominal: Soft. Normal appearance and bowel sounds are normal. He exhibits no shifting dullness, no distension, no pulsatile liver, no fluid wave, no abdominal bruit, no ascites, no pulsatile midline mass and no mass. There is tenderness in the left lower quadrant.  Musculoskeletal: Normal range of motion.  Neurological: He is alert and oriented to person, place, and time.  Skin: Skin is warm, dry and intact. Capillary refill takes less than 2 seconds. No rash noted. He is not diaphoretic. No cyanosis or erythema. No pallor.  Psychiatric: He has a normal mood and affect. His speech is  normal and behavior is normal. Judgment and thought content normal. Cognition and memory are normal.  Nursing note and vitals reviewed.   Results for orders placed or performed in visit on 03/04/17  GC/Chlamydia Probe Amp  Result Value Ref Range   Chlamydia trachomatis, NAA Negative Negative   Neisseria gonorrhoeae by PCR Negative Negative  Microscopic Examination    Result Value Ref Range   WBC, UA 0-5 0 - 5 /hpf   RBC, UA 0-2 0 - 2 /hpf   Epithelial Cells (non renal) CANCELED    Bacteria, UA Few None seen/Few  Comprehensive metabolic panel  Result Value Ref Range   Glucose 80 65 - 99 mg/dL   BUN 9 8 - 27 mg/dL   Creatinine, Ser 0.95 0.76 - 1.27 mg/dL   GFR calc non Af Amer 85 >59 mL/min/1.73   GFR calc Af Amer 98 >59 mL/min/1.73   BUN/Creatinine Ratio 9 (L) 10 - 24   Sodium 143 134 - 144 mmol/L   Potassium 4.7 3.5 - 5.2 mmol/L   Chloride 104 96 - 106 mmol/L   CO2 24 20 - 29 mmol/L   Calcium 9.2 8.6 - 10.2 mg/dL   Total Protein 6.8 6.0 - 8.5 g/dL   Albumin 4.4 3.6 - 4.8 g/dL   Globulin, Total 2.4 1.5 - 4.5 g/dL   Albumin/Globulin Ratio 1.8 1.2 - 2.2   Bilirubin Total 0.4 0.0 - 1.2 mg/dL   Alkaline Phosphatase 85 39 - 117 IU/L   AST 77 (H) 0 - 40 IU/L   ALT 70 (H) 0 - 44 IU/L  CBC with Differential/Platelet  Result Value Ref Range   WBC 7.4 3.4 - 10.8 x10E3/uL   RBC 4.83 4.14 - 5.80 x10E6/uL   Hemoglobin 15.6 13.0 - 17.7 g/dL   Hematocrit 45.2 37.5 - 51.0 %   MCV 94 79 - 97 fL   MCH 32.3 26.6 - 33.0 pg   MCHC 34.5 31.5 - 35.7 g/dL   RDW 13.5 12.3 - 15.4 %   Platelets 119 (L) 150 - 379 x10E3/uL   Neutrophils 71 Not Estab. %   Lymphs 17 Not Estab. %   Monocytes 10 Not Estab. %   Eos 1 Not Estab. %   Basos 1 Not Estab. %   Neutrophils Absolute 5.2 1.4 - 7.0 x10E3/uL   Lymphocytes Absolute 1.3 0.7 - 3.1 x10E3/uL   Monocytes Absolute 0.7 0.1 - 0.9 x10E3/uL   EOS (ABSOLUTE) 0.1 0.0 - 0.4 x10E3/uL   Basophils Absolute 0.1 0.0 - 0.2 x10E3/uL   Immature Granulocytes 0 Not Estab. %   Immature Grans (Abs) 0.0 0.0 - 0.1 x10E3/uL  Lipid Panel w/o Chol/HDL Ratio  Result Value Ref Range   Cholesterol, Total 151 100 - 199 mg/dL   Triglycerides 196 (H) 0 - 149 mg/dL   HDL 43 >39 mg/dL   VLDL Cholesterol Cal 39 5 - 40 mg/dL   LDL Calculated 69 0 - 99 mg/dL  PSA  Result Value Ref Range   Prostate Specific Ag, Serum 11.7 (H) 0.0 - 4.0  ng/mL  Microalbumin, Urine Waived  Result Value Ref Range   Microalb, Ur Waived 10 0 - 19 mg/L   Creatinine, Urine Waived 50 10 - 300 mg/dL   Microalb/Creat Ratio 30-300 (H) <30 mg/g  TSH  Result Value Ref Range   TSH 1.440 0.450 - 4.500 uIU/mL  UA/M w/rflx Culture, Routine  Result Value Ref Range   Specific Gravity, UA 1.010 1.005 - 1.030   pH,  UA 7.0 5.0 - 7.5   Color, UA Straw Yellow   Appearance Ur Clear Clear   Leukocytes, UA Negative Negative   Protein, UA Negative Negative/Trace   Glucose, UA Trace (A) Negative   Ketones, UA Negative Negative   RBC, UA Trace (A) Negative   Bilirubin, UA Negative Negative   Urobilinogen, Ur 0.2 0.2 - 1.0 mg/dL   Nitrite, UA Negative Negative   Microscopic Examination See below:   HIV antibody  Result Value Ref Range   HIV Screen 4th Generation wRfx Non Reactive Non Reactive  RPR  Result Value Ref Range   RPR Ser Ql Non Reactive Non Reactive  HSV(herpes simplex vrs) 1+2 ab-IgG  Result Value Ref Range   HSV 1 Glycoprotein G Ab, IgG 2.44 (H) 0.00 - 0.90 index   HSV 2 IgG, Type Spec <0.91 0.00 - 0.90 index  Hepatitis, Acute  Result Value Ref Range   Hep A IgM Negative Negative   Hepatitis B Surface Ag Negative Negative   Hep B C IgM Negative Negative   Hep C Virus Ab <0.1 0.0 - 0.9 s/co ratio      Assessment & Plan:   Problem List Items Addressed This Visit      Digestive   Cancer of colon ([place])    Now with LLQ, likely diverticulitis, but will check CT abdomen. Await results.       Other Visit Diagnoses    Left lower quadrant abdominal pain    -  Primary   CBC normal, Pain x 2-3 weeks, getting worse. Will check CT abdomen to look for diverticulitis, Hx of colon cancer. Call with any concerns.    Relevant Orders   CT Abdomen Pelvis W Contrast   LLQ pain       Relevant Orders   CBC With Differential/Platelet   UA/M w/rflx Culture, Routine       Follow up plan: Return Pending results.

## 2018-03-04 NOTE — Telephone Encounter (Signed)
Pt came in wanting a plan b option instead of taking test due to cost. Would like to try antibiotics and see if it helps him. [facility] [place].

## 2018-03-04 NOTE — Telephone Encounter (Signed)
Will treat empirically. Call if not getting better on Monday and will do CT

## 2018-03-05 ENCOUNTER — Encounter: Payer: Self-pay | Admitting: Family Medicine

## 2018-06-16 ENCOUNTER — Other Ambulatory Visit: Payer: Self-pay | Admitting: Family Medicine

## 2018-06-16 NOTE — Telephone Encounter (Signed)
Requested Prescriptions  Pending Prescriptions Disp Refills  . omeprazole (PRILOSEC) 40 MG capsule [Pharmacy Med Name: OMEPRAZOLE DR 40 MG CAPSULE] 90 capsule 1    Sig: TAKE 1 CAPSULE (40 MG TOTAL) BY MOUTH DAILY.     Gastroenterology: Proton Pump Inhibitors Passed - 06/16/2018  1:45 AM      Passed - Valid encounter within last 12 months    Recent Outpatient Visits          3 months ago Left lower quadrant abdominal pain   [place], [place], DO   1 year ago Penile rash   [place], [place], DO   1 year ago Other polyneuropathy   [place], [place], DO   1 year ago Varicose vein of leg   [facility] Kathrine Haddock, NP   1 year ago Fever, unspecified fever cause   [facility], [place], DO

## 2018-06-18 ENCOUNTER — Other Ambulatory Visit: Payer: Self-pay | Admitting: Family Medicine

## 2018-06-18 NOTE — Telephone Encounter (Signed)
Requested Prescriptions  Pending Prescriptions Disp Refills  . simvastatin (ZOCOR) 40 MG tablet [Pharmacy Med Name: SIMVASTATIN 40 MG TABLET] 90 tablet 1    Sig: TAKE 1 TABLET (40 MG TOTAL) BY MOUTH DAILY.     Cardiovascular:  Antilipid - Statins Failed - 06/18/2018  1:51 AM      Failed - Total Cholesterol in normal range and within 360 days    Cholesterol, Total  Date Value Ref Range Status  03/04/2017 151 100 - 199 mg/dL Final   Cholesterol Piccolo, Waived  Date Value Ref Range Status  03/27/2016 134 <200 mg/dL Final    Comment:                            Desirable                <200                         Borderline High      200- 239                         High                     >239          Failed - LDL in normal range and within 360 days    LDL Calculated  Date Value Ref Range Status  03/04/2017 69 0 - 99 mg/dL Final         Failed - HDL in normal range and within 360 days    HDL  Date Value Ref Range Status  03/04/2017 43 >39 mg/dL Final         Failed - Triglycerides in normal range and within 360 days    Triglycerides  Date Value Ref Range Status  03/04/2017 196 (H) 0 - 149 mg/dL Final   Triglycerides Piccolo,Waived  Date Value Ref Range Status  03/27/2016 387 (H) <150 mg/dL Final    Comment:                            Normal                   <150                         Borderline High     150 - 199                         High                200 - 499                         Very High                >499          Passed - Patient is not pregnant      Passed - Valid encounter within last 12 months    Recent Outpatient Visits          3 months ago Left lower quadrant abdominal pain   [place] [facility], [place]   1 year ago Penile rash   [facility] [place], Megan  P, DO   1 year ago Other polyneuropathy   [place], Megan P, DO   1 year ago Varicose vein of leg   [facility]  Kathrine Haddock, NP   1 year ago Fever, unspecified fever cause   [facility] [place], [place], DO

## 2018-07-03 ENCOUNTER — Other Ambulatory Visit: Payer: Self-pay | Admitting: Physician Assistant

## 2018-07-05 NOTE — Telephone Encounter (Signed)
Spoke with pt and he stated that he has medication at home and does not need it right now. Also spoke about insurance and he is awaiting call from [employer] building services for reassurance that we accept his insurance.

## 2018-07-05 NOTE — Telephone Encounter (Signed)
Needs follow up appointment.  

## 2018-07-05 NOTE — Telephone Encounter (Signed)
Patient said his insurance is not in network anymore with this office. He would like to know could this be refilled until he can see his new PCP  New patient appt May 12th @ [facility].

## 2018-07-05 NOTE — Telephone Encounter (Signed)
Left message on machine for pt to return call to the office.  

## 2018-07-05 NOTE — Telephone Encounter (Signed)
FYI- see message from [employer]

## 2018-07-16 ENCOUNTER — Encounter: Payer: Self-pay | Admitting: Family Medicine

## 2018-07-16 ENCOUNTER — Ambulatory Visit: Payer: BLUE CROSS/BLUE SHIELD | Admitting: Family Medicine

## 2018-07-16 ENCOUNTER — Other Ambulatory Visit: Payer: Self-pay

## 2018-07-16 VITALS — BP 164/90 | HR 57 | Temp 97.9°F

## 2018-07-16 DIAGNOSIS — IMO0002 Reserved for concepts with insufficient information to code with codable children: Secondary | ICD-10-CM

## 2018-07-16 DIAGNOSIS — R229 Localized swelling, mass and lump, unspecified: Secondary | ICD-10-CM | POA: Diagnosis not present

## 2018-07-16 MED ORDER — AMITRIPTYLINE HCL 25 MG PO TABS: 25.0000 mg | ORAL_TABLET | Freq: Every day | ORAL | 3 refills | Status: DC

## 2018-07-16 NOTE — Progress Notes (Signed)
BP (!) 164/90   Pulse (!) 57   Temp 97.9 F (36.6 C) (Oral)   SpO2 97%    Subjective:    Patient ID: Michael Bonilla, male    DOB: 17-Mar-1956, 63 y.o.   MRN: [id number]  HPI: Michael Bonilla is a 63 y.o. male  Chief Complaint  Patient presents with  . Cyst    left lower leg, described it as a heavy duty bruise   SKIN LESION Duration: couple of years ago showed up, got bigger and started hurting 2 days ago and has been hurting a whole lot Location: L leg Painful: yes Itching: no Onset: gradual Context: bigger Associated signs and symptoms:  History of skin cancer: no History of precancerous skin lesions: no Family history of skin cancer: yes  Relevant past medical, surgical, family and social history reviewed and updated as indicated. Interim medical history since our last visit reviewed. Allergies and medications reviewed and updated.  Review of Systems  Constitutional: Negative.   Respiratory: Negative.   Cardiovascular: Negative.   Musculoskeletal: Negative.   Skin: Negative.   Hematological: Negative.   Psychiatric/Behavioral: Negative.     Per HPI unless specifically indicated above     Objective:    BP (!) 164/90   Pulse (!) 57   Temp 97.9 F (36.6 C) (Oral)   SpO2 97%   Wt Readings from Last 3 Encounters:  03/04/18 203 lb (92.1 kg)  12/29/17 200 lb (90.7 kg)  03/04/17 214 lb 7 oz (97.3 kg)    Physical Exam Vitals signs and nursing note reviewed.  Constitutional:      General: He is not in acute distress.    Appearance: Normal appearance. He is not ill-appearing, toxic-appearing or diaphoretic.  HENT:     Head: Normocephalic and atraumatic.     Right Ear: External ear normal.     Left Ear: External ear normal.     Nose: Nose normal.     Mouth/Throat:     Mouth: Mucous membranes are moist.     Pharynx: Oropharynx is clear.  Eyes:     General: No scleral icterus.       Right eye: No discharge.        Left eye: No discharge.   Extraocular Movements: Extraocular movements intact.     Conjunctiva/sclera: Conjunctivae normal.     Pupils: Pupils are equal, round, and reactive to light.  Neck:     Musculoskeletal: Normal range of motion and neck supple.  Cardiovascular:     Rate and Rhythm: Normal rate and regular rhythm.     Pulses: Normal pulses.     Heart sounds: Normal heart sounds. No murmur. No friction rub. No gallop.   Pulmonary:     Effort: Pulmonary effort is normal. No respiratory distress.     Breath sounds: Normal breath sounds. No stridor. No wheezing, rhonchi or rales.  Chest:     Chest wall: No tenderness.  Musculoskeletal: Normal range of motion.  Skin:    General: Skin is warm and dry.     Capillary Refill: Capillary refill takes less than 2 seconds.     Coloration: Skin is not jaundiced or pale.     Findings: No bruising, erythema, lesion or rash.     Comments: 2 inch slightly tender, mass on lateral side of L leg, no redness, no heat  Neurological:     General: No focal deficit present.     Mental Status: He is alert and  oriented to person, place, and time. Mental status is at baseline.  Psychiatric:        Mood and Affect: Mood normal.        Behavior: Behavior normal.        Thought Content: Thought content normal.        Judgment: Judgment normal.     Results for orders placed or performed in visit on 03/04/18  CBC With Differential/Platelet  Result Value Ref Range   WBC 7.0 3.4 - 10.8 x10E3/uL   RBC 5.10 4.14 - 5.80 x10E6/uL   Hemoglobin 17.2 13.0 - 17.7 g/dL   Hematocrit 48.5 37.5 - 51.0 %   MCV 95 79 - 97 fL   MCH 33.7 (H) 26.6 - 33.0 pg   MCHC 35.5 31.5 - 35.7 g/dL   RDW 13.1 12.3 - 15.4 %   Platelets 116 (L) 150 - 450 x10E3/uL   Neutrophils 71 Not Estab. %   Lymphs 19 Not Estab. %   MID 9 Not Estab. %   Neutrophils Absolute 4.9 1.4 - 7.0 x10E3/uL   Lymphocytes Absolute 1.4 0.7 - 3.1 x10E3/uL   MID (Absolute) 0.7 0.1 - 1.6 X10E3/uL  UA/M w/rflx Culture, Routine   Result Value Ref Range   Specific Gravity, UA 1.015 1.005 - 1.030   pH, UA 6.5 5.0 - 7.5   Color, UA Yellow Yellow   Appearance Ur Hazy (A) Clear   Leukocytes, UA Negative Negative   Protein, UA Trace (A) Negative/Trace   Glucose, UA Negative Negative   Ketones, UA Negative Negative   RBC, UA Negative Negative   Bilirubin, UA Negative Negative   Urobilinogen, Ur 0.2 0.2 - 1.0 mg/dL   Nitrite, UA Negative Negative      Assessment & Plan:   Problem List Items Addressed This Visit    None    Visit Diagnoses    Lump    -  Primary   Painful, enlarging. Treat pain with amitriptiline. Unable to obtain US due to COVID precautions. Get him into general surgery ASAP. Referral generated today.   Relevant Orders   Ambulatory referral to General Surgery       Follow up plan: Return if symptoms worsen or fail to improve.

## 2018-07-22 ENCOUNTER — Telehealth: Payer: Self-pay | Admitting: Family Medicine

## 2018-07-22 NOTE — Telephone Encounter (Signed)
Left message on machine for pt to return call to the office.  

## 2018-07-22 NOTE — Telephone Encounter (Signed)
Unfortunately, there's nothing I can do about this- can we find out what he wanted to discuss?

## 2018-07-22 NOTE — Telephone Encounter (Signed)
Patient would like to know if there is something that he can have to help with the nerve pain during the day, he states that the meds at night help him sleep, but needs something for during the day.  [facility]

## 2018-07-22 NOTE — Telephone Encounter (Signed)
The medicine I gave him is a 24 hour medicine (amitriptyline). He's already on gabapentin which should help with nerve pain. I can put him back on the lyrica- but I think there was cost issue there if I remember right

## 2018-07-22 NOTE — Telephone Encounter (Signed)
Patient notified

## 2018-07-22 NOTE — Telephone Encounter (Signed)
Copied from [place] [id number] [id number]. Topic: Referral - Question >> Jul 21, 2018  3:00 PM Marin Olp L wrote: Reason for CRM: Referral placed by [facility] for his leg can't be scheduled until 90 days out. Patient would like a call back to discuss.

## 2018-08-07 ENCOUNTER — Other Ambulatory Visit: Payer: Self-pay | Admitting: Family Medicine

## 2018-08-07 NOTE — Telephone Encounter (Signed)
Can pt have a refill on this 

## 2018-08-28 ENCOUNTER — Other Ambulatory Visit: Payer: Self-pay | Admitting: Family Medicine

## 2018-11-26 ENCOUNTER — Other Ambulatory Visit: Payer: Self-pay | Admitting: Physician Assistant

## 2018-11-29 NOTE — Telephone Encounter (Signed)
Rx refilled.

## 2019-02-06 ENCOUNTER — Other Ambulatory Visit: Payer: Self-pay | Admitting: Family Medicine

## 2019-02-07 NOTE — Telephone Encounter (Signed)
According to chart, patient is now being seen with [facility] primary care in [place]. Please refuse.

## 2019-02-07 NOTE — Telephone Encounter (Signed)
Requested medication (s) are due for refill today: yes  Requested medication (s) are on the active medication list: yes  Last refill:  11/29/2018  Future visit scheduled:no  Notes to clinic: review for refill Overdue for office visit   Requested Prescriptions  Pending Prescriptions Disp Refills   gabapentin (NEURONTIN) 300 MG capsule [Pharmacy Med Name: GABAPENTIN 300 MG CAPSULE] 270 capsule 0    Sig: TAKE 1 [place]     Neurology: Anticonvulsants - gabapentin Passed - 02/06/2019 10:04 PM      Passed - Valid encounter within last 12 months    Recent Outpatient Visits          6 months ago [place], Megan P, DO   11 months ago Left lower quadrant abdominal pain   [place], [place], DO   1 year ago Penile rash   [place], [place], DO   2 years ago Other polyneuropathy   [employer], [place], DO   2 years ago Varicose vein of leg   [facility] Kathrine Haddock, NP

## 2019-06-03 ENCOUNTER — Ambulatory Visit: Payer: Medicare Other | Attending: Internal Medicine

## 2019-06-03 DIAGNOSIS — Z23 Encounter for immunization: Secondary | ICD-10-CM | POA: Insufficient documentation

## 2019-06-03 NOTE — Progress Notes (Signed)
   Covid-19 Vaccination Clinic  Name:  Michael Bonilla    MRN: [id number] DOB: Jan 28, 1956  06/03/2019  [name] was observed post Covid-19 immunization for 15 minutes without incidence. He was provided with Vaccine Information Sheet and instruction to access the V-Safe system.   [name] was instructed to call 911 with any severe reactions post vaccine: Marland Kitchen Difficulty breathing  . Swelling of your face and throat  . A fast heartbeat  . A bad rash all over your body  . Dizziness and weakness    Immunizations Administered    Name Date Dose VIS Date Route   Pfizer COVID-19 Vaccine 06/03/2019 11:58 AM 0.3 mL 04/01/2019 Intramuscular   Manufacturer: [employer], [employer]   Lot: [phone]   [place]: [id number]

## 2019-06-28 ENCOUNTER — Ambulatory Visit: Payer: Medicare Other | Attending: Internal Medicine

## 2019-06-28 DIAGNOSIS — Z23 Encounter for immunization: Secondary | ICD-10-CM

## 2019-06-28 NOTE — Progress Notes (Signed)
   Covid-19 Vaccination Clinic  Name:  KWELI VANDEMARK    MRN: [id number] DOB: August 03, 1955  06/28/2019  Mr. Simcoe was observed post Covid-19 immunization for 15 minutes without incident. He was provided with Vaccine Information Sheet and instruction to access the V-Safe system.   Mr. Pencek was instructed to call 911 with any severe reactions post vaccine: Marland Kitchen Difficulty breathing  . Swelling of face and throat  . A fast heartbeat  . A bad rash all over body  . Dizziness and weakness   Immunizations Administered    Name Date Dose VIS Date Route   Pfizer COVID-19 Vaccine 06/28/2019 12:36 PM 0.3 mL 04/01/2019 Intramuscular   Manufacturer: [place]   Lot: [id number]   [place]: [id number]

## 2021-07-22 DIAGNOSIS — Z1211 Encounter for screening for malignant neoplasm of colon: Secondary | ICD-10-CM | POA: Diagnosis not present

## 2021-07-22 DIAGNOSIS — Z1212 Encounter for screening for malignant neoplasm of rectum: Secondary | ICD-10-CM | POA: Diagnosis not present

## 2021-09-09 DIAGNOSIS — J40 Bronchitis, not specified as acute or chronic: Secondary | ICD-10-CM | POA: Diagnosis not present

## 2021-09-09 DIAGNOSIS — M545 Low back pain, unspecified: Secondary | ICD-10-CM | POA: Diagnosis not present

## 2021-09-09 DIAGNOSIS — M546 Pain in thoracic spine: Secondary | ICD-10-CM | POA: Diagnosis not present

## 2021-09-09 DIAGNOSIS — I10 Essential (primary) hypertension: Secondary | ICD-10-CM | POA: Diagnosis not present

## 2021-09-18 DIAGNOSIS — Z6828 Body mass index (BMI) 28.0-28.9, adult: Secondary | ICD-10-CM | POA: Diagnosis not present

## 2021-09-18 DIAGNOSIS — Z0000 Encounter for general adult medical examination without abnormal findings: Secondary | ICD-10-CM | POA: Diagnosis not present

## 2021-10-28 DIAGNOSIS — F17210 Nicotine dependence, cigarettes, uncomplicated: Secondary | ICD-10-CM | POA: Diagnosis not present

## 2021-10-28 DIAGNOSIS — I7143 Infrarenal abdominal aortic aneurysm, without rupture: Secondary | ICD-10-CM | POA: Diagnosis not present

## 2021-10-28 DIAGNOSIS — Z136 Encounter for screening for cardiovascular disorders: Secondary | ICD-10-CM | POA: Diagnosis not present

## 2021-11-03 DIAGNOSIS — I714 Abdominal aortic aneurysm, without rupture, unspecified: Secondary | ICD-10-CM | POA: Insufficient documentation

## 2021-11-03 NOTE — Progress Notes (Unsigned)
MRN : [id number]  Michael Bonilla is a 66 y.o. (1955-08-26) male who presents with chief complaint of check circulation.  History of Present Illness:   The patient presents to the office for evaluation of an abdominal aortic aneurysm. The aneurysm was recently evaluated by duplex ultrasound and noted 5.1 cm. Patient denies abdominal pain or unusual back pain, no other abdominal complaints.  No history of an abrupt onset of a painful toe associated with blue discoloration.     No family history of AAA.   Patient denies amaurosis fugax or TIA symptoms. There is no history of claudication or rest pain symptoms of the lower extremities.  The patient denies angina or shortness of breath.  CT scan from 05/2010 show an AAA that measures 2.60 cm   No outpatient medications have been marked as taking for the 11/04/21 encounter (Appointment) with Delana Meyer, Dolores Lory, MD.    Past Medical History:  Diagnosis Date   CAD (coronary artery disease)    Cancer ([place])    Colon   Hyperlipidemia    Hypertension    Myocardial infarction [facility])    Stroke [facility])     Past Surgical History:  Procedure Laterality Date   COLON SURGERY  2012   Partial colectomy    cornary angioplasty   12/08/2005   HERNIA REPAIR     PROSTATE SURGERY  2012   Negative Prostate biopsy    Social History Social History   Tobacco Use   Smoking status: Every Day    Packs/day: 0.10    Types: Cigarettes   Smokeless tobacco: Never  Vaping Use   Vaping Use: Never used  Substance Use Topics   Alcohol use: Yes    Alcohol/week: 12.0 standard drinks of alcohol    Types: 12 Cans of beer per week   Drug use: No    Family History Family History  Problem Relation Age of Onset   Cancer Mother        brain   Heart disease Father     No Known Allergies   REVIEW OF SYSTEMS (Negative unless checked)  Constitutional: '[]'$ Weight loss  '[]'$ Fever  '[]'$ Chills Cardiac: '[]'$ Chest pain   '[]'$ Chest pressure    '[]'$ Palpitations   '[]'$ Shortness of breath when laying flat   '[]'$ Shortness of breath with exertion. Vascular:  '[x]'$ Pain in legs with walking   '[]'$ Pain in legs at rest  '[]'$ History of DVT   '[]'$ Phlebitis   '[]'$ Swelling in legs   '[]'$ Varicose veins   '[]'$ Non-healing ulcers Pulmonary:   '[]'$ Uses home oxygen   '[]'$ Productive cough   '[]'$ Hemoptysis   '[]'$ Wheeze  '[]'$ COPD   '[]'$ Asthma Neurologic:  '[]'$ Dizziness   '[]'$ Seizures   '[]'$ History of stroke   '[]'$ History of TIA  '[]'$ Aphasia   '[]'$ Vissual changes   '[]'$ Weakness or numbness in arm   '[]'$ Weakness or numbness in leg Musculoskeletal:   '[]'$ Joint swelling   '[]'$ Joint pain   '[]'$ Low back pain Hematologic:  '[]'$ Easy bruising  '[]'$ Easy bleeding   '[]'$ Hypercoagulable state   '[]'$ Anemic Gastrointestinal:  '[]'$ Diarrhea   '[]'$ Vomiting  '[]'$ Gastroesophageal reflux/heartburn   '[]'$ Difficulty swallowing. Genitourinary:  '[]'$ Chronic kidney disease   '[]'$ Difficult urination  '[]'$ Frequent urination   '[]'$ Blood in urine Skin:  '[]'$ Rashes   '[]'$ Ulcers  Psychological:  '[]'$ History of anxiety   '[]'$  History of major depression.  Physical Examination  There were no vitals filed for this visit. There is no height or weight on file to calculate BMI.  Gen: WD/WN, NAD Head: [place]/AT, No temporalis wasting.  Ear/Nose/Throat: Hearing grossly intact, nares w/o erythema or drainage Eyes: PER, EOMI, sclera nonicteric.  Neck: Supple, no masses.  No bruit or JVD.  Pulmonary:  Good air movement, no audible wheezing, no use of accessory muscles.  Cardiac: RRR, normal S1, S2, no Murmurs. Vascular:  mild trophic changes, no open wounds Vessel Right Left  Radial Palpable Palpable  PT Not Palpable Not Palpable  DP Not Palpable Not Palpable  Gastrointestinal: soft, non-distended. No guarding/no peritoneal signs.  Musculoskeletal: M/S 5/5 throughout.  No visible deformity.  Neurologic: CN 2-12 intact. Pain and light touch intact in extremities.  Symmetrical.  Speech is fluent. Motor exam as listed above. Psychiatric: Judgment intact, Mood & affect appropriate  for pt's clinical situation. Dermatologic: No rashes or ulcers noted.  No changes consistent with cellulitis.   CBC Lab Results  Component Value Date   WBC 7.0 03/04/2018   HGB 17.2 03/04/2018   HCT 48.5 03/04/2018   MCV 95 03/04/2018   PLT 116 (L) 03/04/2018    BMET    Component Value Date/Time   NA 143 03/04/2017 1013   K 4.7 03/04/2017 1013   CL 104 03/04/2017 1013   CO2 24 03/04/2017 1013   GLUCOSE 80 03/04/2017 1013   BUN 9 03/04/2017 1013   CREATININE 0.95 03/04/2017 1013   CALCIUM 9.2 03/04/2017 1013   GFRNONAA 85 03/04/2017 1013   GFRAA 98 03/04/2017 1013   CrCl cannot be calculated (Patient's most recent lab result is older than the maximum 21 days allowed.).  COAG No results found for: "INR", "PROTIME"  Radiology No results found.   Assessment/Plan There are no diagnoses linked to this encounter.   Hortencia Pilar, MD  11/03/2021 3:51 PM

## 2021-11-04 ENCOUNTER — Ambulatory Visit (INDEPENDENT_AMBULATORY_CARE_PROVIDER_SITE_OTHER): Payer: Medicare Other | Admitting: Vascular Surgery

## 2021-11-04 ENCOUNTER — Encounter (INDEPENDENT_AMBULATORY_CARE_PROVIDER_SITE_OTHER): Payer: Self-pay | Admitting: Vascular Surgery

## 2021-11-04 VITALS — BP 161/78 | HR 60 | Resp 17 | Ht 72.0 in | Wt 210.0 lb

## 2021-11-04 DIAGNOSIS — I25119 Atherosclerotic heart disease of native coronary artery with unspecified angina pectoris: Secondary | ICD-10-CM | POA: Diagnosis not present

## 2021-11-04 DIAGNOSIS — K219 Gastro-esophageal reflux disease without esophagitis: Secondary | ICD-10-CM | POA: Diagnosis not present

## 2021-11-04 DIAGNOSIS — I7143 Infrarenal abdominal aortic aneurysm, without rupture: Secondary | ICD-10-CM

## 2021-11-04 DIAGNOSIS — I739 Peripheral vascular disease, unspecified: Secondary | ICD-10-CM | POA: Diagnosis not present

## 2021-11-04 DIAGNOSIS — E782 Mixed hyperlipidemia: Secondary | ICD-10-CM

## 2021-11-04 DIAGNOSIS — I7133 Infrarenal abdominal aortic aneurysm, ruptured: Secondary | ICD-10-CM

## 2021-11-07 ENCOUNTER — Ambulatory Visit: Payer: Medicare Other | Admitting: Cardiovascular Disease

## 2021-11-07 ENCOUNTER — Other Ambulatory Visit (INDEPENDENT_AMBULATORY_CARE_PROVIDER_SITE_OTHER): Payer: Self-pay | Admitting: Vascular Surgery

## 2021-11-07 DIAGNOSIS — I25119 Atherosclerotic heart disease of native coronary artery with unspecified angina pectoris: Secondary | ICD-10-CM

## 2021-11-07 DIAGNOSIS — I7143 Infrarenal abdominal aortic aneurysm, without rupture: Secondary | ICD-10-CM

## 2021-11-07 NOTE — Progress Notes (Signed)
New patient for [place] hasn't seen a cardiologist.  Has a 5.1 cm AAA  needs clearance for endograft  Thanks

## 2021-11-08 ENCOUNTER — Telehealth (INDEPENDENT_AMBULATORY_CARE_PROVIDER_SITE_OTHER): Payer: Self-pay | Admitting: Vascular Surgery

## 2021-11-08 NOTE — Telephone Encounter (Signed)
LVM for pt advising that I saw CT booked and I made him an appt for CT results with Dr. Delana Meyer on 8.14.23 @ 9:15. I advised to call me and let me know if this works, if not, we can certainly move appt. Nothing further needed at this time.

## 2021-11-12 ENCOUNTER — Ambulatory Visit
Admission: RE | Admit: 2021-11-12 | Discharge: 2021-11-12 | Disposition: A | Discharge status: Home / Self Care | Payer: Medicare Other | Source: Ambulatory Visit | Attending: Vascular Surgery | Admitting: Vascular Surgery

## 2021-11-12 DIAGNOSIS — I7143 Infrarenal abdominal aortic aneurysm, without rupture: Secondary | ICD-10-CM | POA: Diagnosis not present

## 2021-11-12 DIAGNOSIS — I7133 Infrarenal abdominal aortic aneurysm, ruptured: Secondary | ICD-10-CM | POA: Diagnosis not present

## 2021-11-12 LAB — POCT I-STAT CREATININE: Creatinine, Ser: 1.2 mg/dL (ref 0.61–1.24)

## 2021-11-12 MED ORDER — IOHEXOL 350 MG/ML SOLN
100.0000 mL | Freq: Once | INTRAVENOUS | Status: AC | PRN
  Administered 2021-11-12: 100 mL via INTRAVENOUS

## 2021-11-13 ENCOUNTER — Ambulatory Visit: Payer: Medicare Other | Admitting: Cardiovascular Disease

## 2021-11-15 ENCOUNTER — Telehealth (INDEPENDENT_AMBULATORY_CARE_PROVIDER_SITE_OTHER): Payer: Self-pay | Admitting: Vascular Surgery

## 2021-11-15 NOTE — Telephone Encounter (Signed)
LVM for pt advising that I had a cancellation with Dr. Delana Meyer for Monday 7.31.23. I advised I did not know how long that appt would be open and to call me back if he would like to try and get in earlier. Nothing further needed at this time.

## 2021-11-19 ENCOUNTER — Encounter: Payer: Self-pay | Admitting: Cardiology

## 2021-11-19 ENCOUNTER — Ambulatory Visit: Payer: Medicare Other | Admitting: Cardiology

## 2021-11-19 VITALS — BP 130/90 | HR 55 | Ht 72.0 in | Wt 209.0 lb

## 2021-11-19 DIAGNOSIS — I2510 Atherosclerotic heart disease of native coronary artery without angina pectoris: Secondary | ICD-10-CM | POA: Diagnosis not present

## 2021-11-19 DIAGNOSIS — Z01810 Encounter for preprocedural cardiovascular examination: Secondary | ICD-10-CM

## 2021-11-19 DIAGNOSIS — F17200 Nicotine dependence, unspecified, uncomplicated: Secondary | ICD-10-CM

## 2021-11-19 DIAGNOSIS — Z13220 Encounter for screening for lipoid disorders: Secondary | ICD-10-CM

## 2021-11-19 DIAGNOSIS — I739 Peripheral vascular disease, unspecified: Secondary | ICD-10-CM

## 2021-11-19 NOTE — Progress Notes (Signed)
Cardiology Office Note:    Date:  11/19/2021   ID:  Michael Bonilla, DOB 1956/04/20, MRN [id number]  PCP:  Romualdo Bolk, [place] Providers Cardiologist:  None     Referring MD: Romualdo Bolk, FNP   Chief Complaint  Patient presents with   NEW patient-preop clearance; Discuss AAA repair    History of Present Illness:    Michael Bonilla is a 66 y.o. male with a hx of MI (s/p PCI/DES to [place] 2007), hypertension, AAA, current smoker x40+ years who presents for preop evaluation.  Being followed by vascular surgery due to abdominal aortic aneurysm.  Recent ultrasound showed infrarenal AAA measuring 5.1 cm.  Endovascular repair is being planned.  Denies abdominal pain, denies chest pain or shortness of breath.  He had an MI in 2007, drug-eluting stent was placed to OM 3 at [facility].  Has not followed up with cardiology since.  He still smokes, endorses hip pain with ambulation, improved with rest.  Lower extremity arterial ultrasound was ordered by vascular surgical team.    Past Medical History:  Diagnosis Date   CAD (coronary artery disease)    Cancer ([place])    Colon   Hyperlipidemia    Hypertension    Myocardial infarction [facility])    Stroke [facility])     Past Surgical History:  Procedure Laterality Date   COLON SURGERY  2012   Partial colectomy    cornary angioplasty   12/08/2005   HERNIA REPAIR     PROSTATE SURGERY  2012   Negative Prostate biopsy    Current Medications: Current Meds  Medication Sig   amitriptyline (ELAVIL) 25 MG tablet TAKE 1 TABLET BY MOUTH EVERYDAY AT BEDTIME   aspirin 81 MG tablet Take 81 mg by mouth daily.   busPIRone (BUSPAR) 10 MG tablet Take 10 mg by mouth 3 (three) times daily.   gabapentin (NEURONTIN) 300 MG capsule TAKE 1 CAPSULE BY MOUTH THREE TIMES A DAY   metoprolol tartrate (LOPRESSOR) 100 MG tablet TAKE 1 TABLET BY MOUTH TWICE A DAY   nitroGLYCERIN (NITROSTAT) 0.6 MG SL tablet Place 1 tablet (0.6 mg  total) under the tongue every 5 (five) minutes as needed for chest pain.   Omega-3 Fatty Acids (FISH OIL) 1000 MG CAPS Take 1,000 mg by mouth daily.   omeprazole (PRILOSEC) 40 MG capsule TAKE 1 CAPSULE (40 MG TOTAL) BY MOUTH DAILY.   simvastatin (ZOCOR) 40 MG tablet TAKE 1 TABLET (40 MG TOTAL) BY MOUTH DAILY.   tiZANidine (ZANAFLEX) 2 MG tablet TAKE 1 TABLET(2 MG) BY MOUTH EVERY 6 HOURS AS NEEDED   VENTOLIN HFA 108 (90 Base) MCG/ACT inhaler SMARTSIG:2 Puff(s) By Mouth Every 6 Hours PRN     Allergies:   Patient has no known allergies.   Social History   Socioeconomic History   Marital status: Married    Spouse name: Not on file   Number of children: Not on file   Years of education: Not on file   Highest education level: Not on file  Occupational History   Not on file  Tobacco Use   Smoking status: Every Day    Packs/day: 0.10    Types: Cigarettes   Smokeless tobacco: Never   Tobacco comments:    2-3 cigarettes per day  Vaping Use   Vaping Use: Never used  Substance and Sexual Activity   Alcohol use: Yes    Alcohol/week: 12.0 standard drinks of alcohol  Types: 12 Cans of beer per week   Drug use: No   Sexual activity: Yes  Other Topics Concern   Not on file  Social History Narrative   Not on file   Social Determinants of Health   Financial Resource Strain: Not on file  Food Insecurity: Not on file  Transportation Needs: Not on file  Physical Activity: Not on file  Stress: Not on file  Social Connections: Not on file     Family History: The patient's family history includes Cancer in his mother; Heart disease in his father.  ROS:   Please see the history of present illness.     All other systems reviewed and are negative.  EKGs/Labs/Other Studies Reviewed:    The following studies were reviewed today:   EKG:  EKG is  ordered today.  The ekg ordered today demonstrates sinus bradycardia, possible LAE  Recent Labs: 11/12/2021: Creatinine, Ser 1.20  Recent  Lipid Panel    Component Value Date/Time   CHOL 151 03/04/2017 1013   CHOL 134 03/27/2016 1545   TRIG 196 (H) 03/04/2017 1013   TRIG 387 (H) 03/27/2016 1545   HDL 43 03/04/2017 1013   VLDL 77 (H) 03/27/2016 1545   LDLCALC 69 03/04/2017 1013     Risk Assessment/Calculations:        Physical Exam:    VS:  BP (!) 130/90 (BP Location: Right Arm, Patient Position: Sitting, Cuff Size: Large)   Pulse (!) 55   Ht 6' (1.829 m)   Wt 209 lb (94.8 kg)   SpO2 99%   BMI 28.35 kg/m     Wt Readings from Last 3 Encounters:  11/19/21 209 lb (94.8 kg)  11/04/21 210 lb (95.3 kg)  03/04/18 203 lb (92.1 kg)     GEN:  Well nourished, well developed in no acute distress HEENT: Normal NECK: No JVD; No carotid bruits CARDIAC: RRR, no murmurs, rubs, gallops RESPIRATORY: Reduced breath sounds, otherwise clear ABDOMEN: Soft, non-tender, non-distended MUSCULOSKELETAL:  No edema; No deformity  SKIN: Warm and dry NEUROLOGIC:  Alert and oriented x 3 PSYCHIATRIC:  Normal affect   ASSESSMENT:    1. Pre-operative cardiovascular examination   2. Coronary artery disease involving native coronary artery of native heart without angina pectoris   3. Smoking   4. Claudication ([place])   5. Screening for hyperlipidemia    PLAN:    In order of problems listed above:  Preop cardiovascular exam, AAA repair is being planned.  History of MI.  Get echo, get Lexiscan Myoview.  Continue aspirin, statin as prescribed. History of MI s/p DES to OM 06/2005.  Denies chest pain.  Continue aspirin, simvastatin, Lipitor.  Obtain fasting lipid profile.  Get echo and Myoview as above. Current smoker, cessation advised. Claudication, lower extremity arterial ultrasound ordered by vascular surgery.  Keep follow-up appointment with vascular surgery as scheduled.  Follow-up after echo and Myoview.      Shared Decision Making/Informed Consent The risks [chest pain, shortness of breath, cardiac arrhythmias, dizziness,  blood pressure fluctuations, myocardial infarction, stroke/transient ischemic attack, nausea, vomiting, allergic reaction, radiation exposure, metallic taste sensation and life-threatening complications (estimated to be 1 in 10,000)], benefits (risk stratification, diagnosing coronary artery disease, treatment guidance) and alternatives of a nuclear stress test were discussed in detail with Mr. Collard and he agrees to proceed.    Medication Adjustments/Labs and Tests Ordered: Current medicines are reviewed at length with the patient today.  Concerns regarding medicines are outlined above.  Orders  Placed This Encounter  Procedures   NM Myocar Multi W/Spect W/Wall Motion / EF   Lipid panel   EKG 12-Lead   ECHOCARDIOGRAM COMPLETE   No orders of the defined types were placed in this encounter.   Patient Instructions  Medication Instructions:   Your physician recommends that you continue on your current medications as directed. Please refer to the Current Medication list given to you today.   *If you need a refill on your cardiac medications before your next appointment, please call your pharmacy*   Lab Work:  Your physician recommends that you return for a FASTING lipid profile: at your earliest convenience.  - You will need to be fasting. Please do not have anything to eat or drink after midnight the morning you have the lab work. You may only have water or black coffee with no cream or sugar.   - Please go to the [facility]. You will check in at the front desk to the right as you walk into the atrium. Valet Parking is offered if needed. - No appointment needed. You may go any day between 7 am and 6 pm.     Testing/Procedures:  Your physician has requested that you have an echocardiogram. Echocardiography is a painless test that uses sound waves to create images of your heart. It provides your doctor with information about the size and shape of your heart and how well your  heart's chambers and valves are working. This procedure takes approximately one hour. There are no restrictions for this procedure.   2.    [place]   Your caregiver has ordered a Stress Test with nuclear imaging. The purpose of this test is to evaluate the blood supply to your heart muscle. This procedure is referred to as a "Non-Invasive Stress Test." This is because other than having an IV started in your vein, nothing is inserted or "invades" your body. Cardiac stress tests are done to find areas of poor blood flow to the heart by determining the extent of coronary artery disease (CAD). Some patients exercise on a treadmill, which naturally increases the blood flow to your heart, while others who are  unable to walk on a treadmill due to physical limitations have a pharmacologic/chemical stress agent called Lexiscan . This medicine will mimic walking on a treadmill by temporarily increasing your coronary blood flow.      PLEASE REPORT TO [facility] MEDICAL MALL ENTRANCE   THE VOLUNTEERS AT THE FIRST DESK WILL DIRECT YOU WHERE TO GO     *Please note: these test may take anywhere between 2-4 hours to complete       Date of Procedure:_____________________________________   Arrival Time for Procedure:______________________________    PLEASE NOTIFY THE OFFICE AT LEAST 24 HOURS IN ADVANCE IF YOU ARE UNABLE TO KEEP YOUR APPOINTMENT.  [place] 24 HOURS IN ADVANCE IF YOU ARE UNABLE TO KEEP YOUR APPOINTMENT. [phone]      How to prepare for your Myoview test:    1. Do not eat or drink after midnight  2. No caffeine for 24 hours prior to test  3. No smoking 24 hours prior to test.  4. Unless instructed otherwise, Take your medication with a small sips of water.    5.         Ladies, please do not wear dresses. Skirts or pants are appropriate. Please wear a short sleeve shirt.  6. No perfume, cologne or  lotion.  7. Wear comfortable  walking shoes. No heels!     Follow-Up: At [facility], you and your health needs are our priority.  As part of our continuing mission to provide you with exceptional heart care, we have created designated Provider Care Teams.  These Care Teams include your primary Cardiologist (physician) and Advanced Practice Providers (APPs -  Physician Assistants and Nurse Practitioners) who all work together to provide you with the care you need, when you need it.  We recommend signing up for the patient portal called "MyChart".  Sign up information is provided on this After Visit Summary.  MyChart is used to connect with patients for Virtual Visits (Telemedicine).  Patients are able to view lab/test results, encounter notes, upcoming appointments, etc.  Non-urgent messages can be sent to your provider as well.   To learn more about what you can do with MyChart, go to [email or web address].    Your next appointment:   6 week(s)  The format for your next appointment:   In Person  Provider:   Kate Sable, MD    Other Instructions   Important Information About Sugar         Signed, Kate Sable, MD  11/19/2021 10:50 AM    [place]

## 2021-11-19 NOTE — Patient Instructions (Signed)
Medication Instructions:   Your physician recommends that you continue on your current medications as directed. Please refer to the Current Medication list given to you today.   *If you need a refill on your cardiac medications before your next appointment, please call your pharmacy*   Lab Work:  Your physician recommends that you return for a FASTING lipid profile: at your earliest convenience.  - You will need to be fasting. Please do not have anything to eat or drink after midnight the morning you have the lab work. You may only have water or black coffee with no cream or sugar.   - Please go to the [facility]. You will check in at the front desk to the right as you walk into the atrium. Valet Parking is offered if needed. - No appointment needed. You may go any day between 7 am and 6 pm.     Testing/Procedures:  Your physician has requested that you have an echocardiogram. Echocardiography is a painless test that uses sound waves to create images of your heart. It provides your doctor with information about the size and shape of your heart and how well your heart's chambers and valves are working. This procedure takes approximately one hour. There are no restrictions for this procedure.   2.    [place]   Your caregiver has ordered a Stress Test with nuclear imaging. The purpose of this test is to evaluate the blood supply to your heart muscle. This procedure is referred to as a "Non-Invasive Stress Test." This is because other than having an IV started in your vein, nothing is inserted or "invades" your body. Cardiac stress tests are done to find areas of poor blood flow to the heart by determining the extent of coronary artery disease (CAD). Some patients exercise on a treadmill, which naturally increases the blood flow to your heart, while others who are  unable to walk on a treadmill due to physical limitations have a pharmacologic/chemical stress agent called Lexiscan .  This medicine will mimic walking on a treadmill by temporarily increasing your coronary blood flow.      PLEASE REPORT TO [facility] MEDICAL MALL ENTRANCE   THE VOLUNTEERS AT THE FIRST DESK WILL DIRECT YOU WHERE TO GO     *Please note: these test may take anywhere between 2-4 hours to complete       Date of Procedure:_____________________________________   Arrival Time for Procedure:______________________________    PLEASE NOTIFY THE OFFICE AT LEAST 24 HOURS IN ADVANCE IF YOU ARE UNABLE TO KEEP YOUR APPOINTMENT.  [place] 24 HOURS IN ADVANCE IF YOU ARE UNABLE TO KEEP YOUR APPOINTMENT. [phone]      How to prepare for your Myoview test:    1. Do not eat or drink after midnight  2. No caffeine for 24 hours prior to test  3. No smoking 24 hours prior to test.  4. Unless instructed otherwise, Take your medication with a small sips of water.    5.         Ladies, please do not wear dresses. Skirts or pants are appropriate. Please wear a short sleeve shirt.  6. No perfume, cologne or lotion.  7. Wear comfortable walking shoes. No heels!     Follow-Up: At [facility], you and your health needs are our priority.  As part of our continuing mission to provide you with exceptional heart care, we have created designated Provider Care  Teams.  These Care Teams include your primary Cardiologist (physician) and Advanced Practice Providers (APPs -  Physician Assistants and Nurse Practitioners) who all work together to provide you with the care you need, when you need it.  We recommend signing up for the patient portal called "MyChart".  Sign up information is provided on this After Visit Summary.  MyChart is used to connect with patients for Virtual Visits (Telemedicine).  Patients are able to view lab/test results, encounter notes, upcoming appointments, etc.  Non-urgent messages can be sent to your provider as well.   To learn more about what  you can do with MyChart, go to [email or web address].    Your next appointment:   6 week(s)  The format for your next appointment:   In Person  Provider:   Kate Sable, MD    Other Instructions   Important Information About Sugar

## 2021-11-20 ENCOUNTER — Other Ambulatory Visit: Payer: Medicare Other

## 2021-11-21 ENCOUNTER — Other Ambulatory Visit: Payer: Medicare Other

## 2021-11-25 ENCOUNTER — Ambulatory Visit: Payer: Medicare Other

## 2021-12-02 ENCOUNTER — Ambulatory Visit (INDEPENDENT_AMBULATORY_CARE_PROVIDER_SITE_OTHER): Payer: Medicare Other | Admitting: Vascular Surgery

## 2021-12-10 DIAGNOSIS — I714 Abdominal aortic aneurysm, without rupture, unspecified: Secondary | ICD-10-CM | POA: Diagnosis not present

## 2021-12-10 DIAGNOSIS — J40 Bronchitis, not specified as acute or chronic: Secondary | ICD-10-CM | POA: Diagnosis not present

## 2021-12-10 DIAGNOSIS — M47817 Spondylosis without myelopathy or radiculopathy, lumbosacral region: Secondary | ICD-10-CM | POA: Diagnosis not present

## 2021-12-10 DIAGNOSIS — R059 Cough, unspecified: Secondary | ICD-10-CM | POA: Diagnosis not present

## 2021-12-18 DIAGNOSIS — M545 Low back pain, unspecified: Secondary | ICD-10-CM | POA: Diagnosis not present

## 2021-12-18 DIAGNOSIS — I714 Abdominal aortic aneurysm, without rupture, unspecified: Secondary | ICD-10-CM | POA: Diagnosis not present

## 2021-12-18 DIAGNOSIS — K7030 Alcoholic cirrhosis of liver without ascites: Secondary | ICD-10-CM | POA: Diagnosis not present

## 2021-12-18 DIAGNOSIS — M546 Pain in thoracic spine: Secondary | ICD-10-CM | POA: Diagnosis not present

## 2021-12-30 ENCOUNTER — Ambulatory Visit: Payer: Medicare Other | Admitting: Cardiology

## 2022-02-17 ENCOUNTER — Encounter (INDEPENDENT_AMBULATORY_CARE_PROVIDER_SITE_OTHER): Payer: Self-pay

## 2022-05-14 ENCOUNTER — Other Ambulatory Visit: Payer: Self-pay

## 2022-05-14 ENCOUNTER — Observation Stay
Admission: EM | Admit: 2022-05-14 | Discharge: 2022-05-15 | Disposition: A | Discharge status: Home / Self Care | Payer: Medicare Other | Attending: Internal Medicine | Admitting: Internal Medicine

## 2022-05-14 ENCOUNTER — Emergency Department: Payer: Medicare Other

## 2022-05-14 DIAGNOSIS — K8020 Calculus of gallbladder without cholecystitis without obstruction: Secondary | ICD-10-CM | POA: Diagnosis not present

## 2022-05-14 DIAGNOSIS — Z9049 Acquired absence of other specified parts of digestive tract: Secondary | ICD-10-CM | POA: Diagnosis not present

## 2022-05-14 DIAGNOSIS — S2241XA Multiple fractures of ribs, right side, initial encounter for closed fracture: Secondary | ICD-10-CM | POA: Diagnosis not present

## 2022-05-14 DIAGNOSIS — S0990XA Unspecified injury of head, initial encounter: Secondary | ICD-10-CM | POA: Diagnosis not present

## 2022-05-14 DIAGNOSIS — F17210 Nicotine dependence, cigarettes, uncomplicated: Secondary | ICD-10-CM | POA: Insufficient documentation

## 2022-05-14 DIAGNOSIS — S2231XA Fracture of one rib, right side, initial encounter for closed fracture: Secondary | ICD-10-CM | POA: Insufficient documentation

## 2022-05-14 DIAGNOSIS — R52 Pain, unspecified: Secondary | ICD-10-CM | POA: Diagnosis not present

## 2022-05-14 DIAGNOSIS — E785 Hyperlipidemia, unspecified: Secondary | ICD-10-CM | POA: Diagnosis present

## 2022-05-14 DIAGNOSIS — Z8673 Personal history of transient ischemic attack (TIA), and cerebral infarction without residual deficits: Secondary | ICD-10-CM | POA: Insufficient documentation

## 2022-05-14 DIAGNOSIS — Z79899 Other long term (current) drug therapy: Secondary | ICD-10-CM | POA: Diagnosis not present

## 2022-05-14 DIAGNOSIS — S2220XA Unspecified fracture of sternum, initial encounter for closed fracture: Secondary | ICD-10-CM | POA: Diagnosis not present

## 2022-05-14 DIAGNOSIS — I10 Essential (primary) hypertension: Secondary | ICD-10-CM | POA: Diagnosis not present

## 2022-05-14 DIAGNOSIS — I739 Peripheral vascular disease, unspecified: Secondary | ICD-10-CM | POA: Diagnosis present

## 2022-05-14 DIAGNOSIS — I714 Abdominal aortic aneurysm, without rupture, unspecified: Secondary | ICD-10-CM | POA: Diagnosis present

## 2022-05-14 DIAGNOSIS — S2222XA Fracture of body of sternum, initial encounter for closed fracture: Secondary | ICD-10-CM

## 2022-05-14 DIAGNOSIS — I2510 Atherosclerotic heart disease of native coronary artery without angina pectoris: Secondary | ICD-10-CM | POA: Diagnosis not present

## 2022-05-14 DIAGNOSIS — Z85038 Personal history of other malignant neoplasm of large intestine: Secondary | ICD-10-CM | POA: Diagnosis not present

## 2022-05-14 DIAGNOSIS — Z7982 Long term (current) use of aspirin: Secondary | ICD-10-CM | POA: Insufficient documentation

## 2022-05-14 DIAGNOSIS — R001 Bradycardia, unspecified: Secondary | ICD-10-CM | POA: Diagnosis not present

## 2022-05-14 DIAGNOSIS — R079 Chest pain, unspecified: Principal | ICD-10-CM | POA: Insufficient documentation

## 2022-05-14 DIAGNOSIS — R0789 Other chest pain: Secondary | ICD-10-CM | POA: Diagnosis not present

## 2022-05-14 DIAGNOSIS — M542 Cervicalgia: Secondary | ICD-10-CM | POA: Diagnosis not present

## 2022-05-14 DIAGNOSIS — R519 Headache, unspecified: Secondary | ICD-10-CM | POA: Diagnosis not present

## 2022-05-14 DIAGNOSIS — Z955 Presence of coronary angioplasty implant and graft: Secondary | ICD-10-CM | POA: Diagnosis not present

## 2022-05-14 DIAGNOSIS — K219 Gastro-esophageal reflux disease without esophagitis: Secondary | ICD-10-CM | POA: Diagnosis present

## 2022-05-14 DIAGNOSIS — Z720 Tobacco use: Secondary | ICD-10-CM | POA: Diagnosis present

## 2022-05-14 LAB — CBC WITH DIFFERENTIAL/PLATELET
Abs Immature Granulocytes: 0.08 10*3/uL — ABNORMAL HIGH (ref 0.00–0.07)
Basophils Absolute: 0.1 10*3/uL (ref 0.0–0.1)
Basophils Relative: 1 %
Eosinophils Absolute: 0 10*3/uL (ref 0.0–0.5)
Eosinophils Relative: 0 %
HCT: 50.1 % (ref 39.0–52.0)
Hemoglobin: 16.4 g/dL (ref 13.0–17.0)
Immature Granulocytes: 1 %
Lymphocytes Relative: 11 %
Lymphs Abs: 1.1 10*3/uL (ref 0.7–4.0)
MCH: 31.7 pg (ref 26.0–34.0)
MCHC: 32.7 g/dL (ref 30.0–36.0)
MCV: 96.9 fL (ref 80.0–100.0)
Monocytes Absolute: 0.6 10*3/uL (ref 0.1–1.0)
Monocytes Relative: 6 %
Neutro Abs: 8 10*3/uL — ABNORMAL HIGH (ref 1.7–7.7)
Neutrophils Relative %: 81 %
Platelets: 93 10*3/uL — ABNORMAL LOW (ref 150–400)
RBC: 5.17 MIL/uL (ref 4.22–5.81)
RDW: 12.6 % (ref 11.5–15.5)
WBC: 9.8 10*3/uL (ref 4.0–10.5)
nRBC: 0 % (ref 0.0–0.2)

## 2022-05-14 LAB — TROPONIN I (HIGH SENSITIVITY)
Troponin I (High Sensitivity): 15 ng/L (ref <18)
Troponin I (High Sensitivity): 17 ng/L (ref <18)

## 2022-05-14 LAB — BASIC METABOLIC PANEL
Anion gap: 12 (ref 5–15)
BUN: 11 mg/dL (ref 8–23)
CO2: 23 mmol/L (ref 22–32)
Calcium: 9 mg/dL (ref 8.9–10.3)
Chloride: 102 mmol/L (ref 98–111)
Creatinine, Ser: 0.96 mg/dL (ref 0.61–1.24)
GFR, Estimated: >60 mL/min (ref >60)
Glucose, Bld: 124 mg/dL — ABNORMAL HIGH (ref 70–99)
Potassium: 4.3 mmol/L (ref 3.5–5.1)
Sodium: 137 mmol/L (ref 135–145)

## 2022-05-14 MED ORDER — MORPHINE SULFATE (PF) 4 MG/ML IV SOLN
4.0000 mg | INTRAVENOUS | Status: DC | PRN
  Administered 2022-05-14 – 2022-05-15 (×2): 4 mg via INTRAVENOUS
  Filled 2022-05-14 (×2): qty 1

## 2022-05-14 MED ORDER — ONDANSETRON HCL 4 MG/2ML IJ SOLN: 4.0000 mg | Freq: Four times a day (QID) | INTRAMUSCULAR | Status: DC | PRN

## 2022-05-14 MED ORDER — ACETAMINOPHEN 325 MG PO TABS: 650.0000 mg | ORAL_TABLET | Freq: Four times a day (QID) | ORAL | Status: DC | PRN

## 2022-05-14 MED ORDER — METOPROLOL TARTRATE 50 MG PO TABS
100.0000 mg | ORAL_TABLET | Freq: Two times a day (BID) | ORAL | Status: DC
  Administered 2022-05-15: 100 mg via ORAL
  Filled 2022-05-14: qty 2

## 2022-05-14 MED ORDER — MORPHINE SULFATE (PF) 2 MG/ML IV SOLN
2.0000 mg | INTRAVENOUS | Status: DC | PRN
  Administered 2022-05-14: 2 mg via INTRAVENOUS
  Filled 2022-05-14 (×2): qty 1

## 2022-05-14 MED ORDER — FENTANYL CITRATE PF 50 MCG/ML IJ SOSY
25.0000 ug | PREFILLED_SYRINGE | INTRAMUSCULAR | Status: DC | PRN
  Administered 2022-05-15: 25 ug via INTRAVENOUS
  Filled 2022-05-14: qty 1

## 2022-05-14 MED ORDER — ACETAMINOPHEN 650 MG RE SUPP: 650.0000 mg | Freq: Four times a day (QID) | RECTAL | Status: DC | PRN

## 2022-05-14 MED ORDER — PANTOPRAZOLE SODIUM 40 MG PO TBEC
80.0000 mg | DELAYED_RELEASE_TABLET | Freq: Every day | ORAL | Status: DC
  Administered 2022-05-15: 80 mg via ORAL
  Filled 2022-05-14: qty 2

## 2022-05-14 MED ORDER — SENNOSIDES-DOCUSATE SODIUM 8.6-50 MG PO TABS: 1.0000 | ORAL_TABLET | Freq: Every evening | ORAL | Status: DC | PRN

## 2022-05-14 MED ORDER — ONDANSETRON HCL 4 MG PO TABS: 4.0000 mg | ORAL_TABLET | Freq: Four times a day (QID) | ORAL | Status: DC | PRN

## 2022-05-14 MED ORDER — NICOTINE 21 MG/24HR TD PT24: 21.0000 mg | MEDICATED_PATCH | Freq: Every day | TRANSDERMAL | Status: DC | PRN

## 2022-05-14 MED ORDER — IOHEXOL 350 MG/ML SOLN
100.0000 mL | Freq: Once | INTRAVENOUS | Status: AC | PRN
  Administered 2022-05-14: 100 mL via INTRAVENOUS

## 2022-05-14 MED ORDER — ONDANSETRON HCL 4 MG/2ML IJ SOLN
4.0000 mg | Freq: Once | INTRAMUSCULAR | Status: AC
  Administered 2022-05-14: 4 mg via INTRAVENOUS
  Filled 2022-05-14: qty 2

## 2022-05-14 MED ORDER — BUSPIRONE HCL 10 MG PO TABS
10.0000 mg | ORAL_TABLET | Freq: Three times a day (TID) | ORAL | Status: DC
  Administered 2022-05-15: 10 mg via ORAL
  Filled 2022-05-14: qty 1

## 2022-05-14 MED ORDER — OXYCODONE-ACETAMINOPHEN 5-325 MG PO TABS
1.0000 | ORAL_TABLET | Freq: Four times a day (QID) | ORAL | Status: DC | PRN
  Administered 2022-05-14 – 2022-05-15 (×3): 1 via ORAL
  Filled 2022-05-14 (×4): qty 1

## 2022-05-14 MED ORDER — ACETAMINOPHEN 10 MG/ML IV SOLN: 1000.0000 mg | Freq: Four times a day (QID) | INTRAVENOUS | Status: DC | PRN

## 2022-05-14 MED ORDER — SIMVASTATIN 20 MG PO TABS
40.0000 mg | ORAL_TABLET | Freq: Every day | ORAL | Status: DC
  Administered 2022-05-14: 40 mg via ORAL
  Filled 2022-05-14: qty 2

## 2022-05-14 MED ORDER — MORPHINE SULFATE (PF) 4 MG/ML IV SOLN
4.0000 mg | Freq: Once | INTRAVENOUS | Status: AC
  Administered 2022-05-14: 4 mg via INTRAVENOUS
  Filled 2022-05-14: qty 1

## 2022-05-14 MED ORDER — GABAPENTIN 300 MG PO CAPS
300.0000 mg | ORAL_CAPSULE | Freq: Three times a day (TID) | ORAL | Status: DC
  Administered 2022-05-14 – 2022-05-15 (×3): 300 mg via ORAL
  Filled 2022-05-14 (×3): qty 1

## 2022-05-14 MED ORDER — SODIUM CHLORIDE 0.9 % IV BOLUS
1000.0000 mL | Freq: Once | INTRAVENOUS | Status: AC
  Administered 2022-05-14: 1000 mL via INTRAVENOUS

## 2022-05-14 MED ORDER — ALBUTEROL SULFATE (2.5 MG/3ML) 0.083% IN NEBU: 3.0000 mL | INHALATION_SOLUTION | Freq: Four times a day (QID) | RESPIRATORY_TRACT | Status: DC | PRN

## 2022-05-14 MED ORDER — NITROGLYCERIN 0.4 MG SL SUBL: 0.4000 mg | SUBLINGUAL_TABLET | SUBLINGUAL | Status: DC | PRN
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
## 2022-05-14 NOTE — ED Notes (Signed)
Pt is diaphoretic and nauseous. Was placed on 5 lead and HR is 39-43. Was sinus brady on first EKG. Obtainming repeat EKG at this time. EDP and PA made aware.
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
## 2022-05-14 NOTE — Assessment & Plan Note (Addendum)
-  PRN nicotine patch ordered

## 2022-05-14 NOTE — ED Notes (Signed)
Helped pt stand and use urinal.

## 2022-05-14 NOTE — ED Notes (Signed)
Floor RN called back and said ok to bring pt now.
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
## 2022-05-14 NOTE — Assessment & Plan Note (Signed)
-  Appears to be at baseline - Patient had outpatient preop evaluation at [facility] cardiology (11/19/21) clinic, it was noted that his heart rate was 55 at the time

## 2022-05-14 NOTE — ED Notes (Signed)
Floor RN's made purple man yellow again so they have more time to review chart. Transport is already here for pt. Pt now waiting to be brought up. Floor RN's said will call this RN back.
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
## 2022-05-14 NOTE — Assessment & Plan Note (Signed)
-  PPI resumed °

## 2022-05-14 NOTE — Assessment & Plan Note (Signed)
-  Right-sided second rib fracture - Incentive spirometry and flutter valve ordered
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
## 2022-05-14 NOTE — ED Notes (Signed)
Pt has better color in face and is not diaphoretic. Still SB on monitor. Pt complains that pain is still 10/1- and R ribs hurt.

## 2022-05-14 NOTE — Assessment & Plan Note (Signed)
-  Metoprolol 100 mg p.o. twice daily, simvastatin 40 mg daily, nitroglycerin sublingual q. 5 minutes as needed for chest pain resumed
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
## 2022-05-14 NOTE — ED Notes (Signed)
Spoke with daughter and gave update and put pt on phone with daughter.
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
## 2022-05-14 NOTE — ED Notes (Signed)
Wrote to provider to request IV tylenol for pt.
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
## 2022-05-14 NOTE — Assessment & Plan Note (Signed)
-  Simvastatin 40 mg daily resumed 

## 2022-05-14 NOTE — ED Notes (Signed)
Dr Archie Balboa and Ashok Cordia PA at bedside.

## 2022-05-14 NOTE — ED Notes (Signed)
Informed rn bed assigned

## 2022-05-15 DIAGNOSIS — S2222XA Fracture of body of sternum, initial encounter for closed fracture: Secondary | ICD-10-CM

## 2022-05-15 DIAGNOSIS — S2231XA Fracture of one rib, right side, initial encounter for closed fracture: Secondary | ICD-10-CM | POA: Diagnosis not present

## 2022-05-15 DIAGNOSIS — R52 Pain, unspecified: Secondary | ICD-10-CM | POA: Diagnosis not present

## 2022-05-15 LAB — CBC
HCT: 47.6 % (ref 39.0–52.0)
Hemoglobin: 15.8 g/dL (ref 13.0–17.0)
MCH: 32.2 pg (ref 26.0–34.0)
MCHC: 33.2 g/dL (ref 30.0–36.0)
MCV: 96.9 fL (ref 80.0–100.0)
Platelets: 80 10*3/uL — ABNORMAL LOW (ref 150–400)
RBC: 4.91 MIL/uL (ref 4.22–5.81)
RDW: 12.9 % (ref 11.5–15.5)
WBC: 9.3 10*3/uL (ref 4.0–10.5)
nRBC: 0 % (ref 0.0–0.2)

## 2022-05-15 LAB — BASIC METABOLIC PANEL
Anion gap: 9 (ref 5–15)
BUN: 8 mg/dL (ref 8–23)
CO2: 26 mmol/L (ref 22–32)
Calcium: 8.6 mg/dL — ABNORMAL LOW (ref 8.9–10.3)
Chloride: 100 mmol/L (ref 98–111)
Creatinine, Ser: 1.04 mg/dL (ref 0.61–1.24)
GFR, Estimated: >60 mL/min (ref >60)
Glucose, Bld: 112 mg/dL — ABNORMAL HIGH (ref 70–99)
Potassium: 3.6 mmol/L (ref 3.5–5.1)
Sodium: 135 mmol/L (ref 135–145)

## 2022-05-15 LAB — MAGNESIUM: Magnesium: 2 mg/dL (ref 1.7–2.4)

## 2022-05-15 MED ORDER — LIDOCAINE 5 % EX PTCH: 1.0000 | MEDICATED_PATCH | CUTANEOUS | 0 refills | Status: DC

## 2022-05-15 MED ORDER — GUAIFENESIN-DM 100-10 MG/5ML PO SYRP: 5.0000 mL | ORAL_SOLUTION | ORAL | 0 refills | Status: DC | PRN

## 2022-05-15 MED ORDER — METHOCARBAMOL 500 MG PO TABS: 500.0000 mg | ORAL_TABLET | Freq: Three times a day (TID) | ORAL | 0 refills | Status: AC | PRN

## 2022-05-15 MED ORDER — DIPHENHYDRAMINE HCL 25 MG PO CAPS
25.0000 mg | ORAL_CAPSULE | Freq: Once | ORAL | Status: AC
  Administered 2022-05-15: 25 mg via ORAL
  Filled 2022-05-15: qty 1

## 2022-05-15 MED ORDER — LIDOCAINE 5 % EX PTCH
1.0000 | MEDICATED_PATCH | CUTANEOUS | Status: DC
  Filled 2022-05-15: qty 1

## 2022-05-15 MED ORDER — GUAIFENESIN 100 MG/5ML PO LIQD
5.0000 mL | ORAL | Status: DC | PRN
  Filled 2022-05-15: qty 10

## 2022-05-15 MED ORDER — OXYCODONE-ACETAMINOPHEN 5-325 MG PO TABS: 1.0000 | ORAL_TABLET | Freq: Four times a day (QID) | ORAL | 0 refills | Status: DC | PRN

## 2022-05-15 MED ORDER — GUAIFENESIN-DM 100-10 MG/5ML PO SYRP
5.0000 mL | ORAL_SOLUTION | ORAL | Status: DC | PRN
  Administered 2022-05-15 (×3): 5 mL via ORAL
  Filled 2022-05-15 (×3): qty 10

## 2022-05-15 NOTE — Progress Notes (Signed)
Pt discharged with belongings bag, incentive spirometer, and flutter gauge. Pt discharge instructions given and no questions asked. Transportation provided by pt's daughter.
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
## 2022-05-15 NOTE — Plan of Care (Signed)
  Problem: Clinical Measurements: Goal: Ability to maintain clinical measurements within normal limits will improve 05/15/2022 1113 by Jodi Marble, LPN Outcome: Adequate for Discharge 05/15/2022 1113 by Jodi Marble, LPN Outcome: Adequate for Discharge   Problem: Clinical Measurements: Goal: Will remain free from infection 05/15/2022 1113 by Jodi Marble, LPN Outcome: Adequate for Discharge 05/15/2022 1113 by Jodi Marble, LPN Outcome: Adequate for Discharge   Problem: Clinical Measurements: Goal: Diagnostic test results will improve 05/15/2022 1113 by Jodi Marble, LPN Outcome: Adequate for Discharge 05/15/2022 1113 by Jodi Marble, LPN Outcome: Adequate for Discharge   Problem: Clinical Measurements: Goal: Respiratory complications will improve 05/15/2022 1113 by Jodi Marble, LPN Outcome: Adequate for Discharge 05/15/2022 1113 by Jodi Marble, LPN Outcome: Adequate for Discharge   Problem: Clinical Measurements: Goal: Cardiovascular complication will be avoided 05/15/2022 1113 by Jodi Marble, LPN Outcome: Adequate for Discharge 05/15/2022 1113 by Jodi Marble, LPN Outcome: Adequate for Discharge   Problem: Activity: Goal: Risk for activity intolerance will decrease 05/15/2022 1113 by Jodi Marble, LPN Outcome: Adequate for Discharge 05/15/2022 1113 by Jodi Marble, LPN Outcome: Adequate for Discharge

## 2022-05-15 NOTE — TOC Transition Note (Signed)
Transition of Care [facility]) - CM/SW Discharge Note   Patient Details  Name: Michael Bonilla MRN: [id number] Date of Birth: Dec 06, 1955  [facility] CM/SW Contact:  Quin Hoop, LCSW Phone Number: 05/15/2022, 12:41 PM   Clinical Narrative:    Patient discharging home with the following recommendations:  Recommendations at discharge:  Please obtain CBC and BMP in 1 week Follow-up with primary care provider within a week Please arrange follow-up with vascular surgery for abdominal aortic aneurysm noted on CT abdomen  Final next level of care: Home/Self Care Barriers to Discharge: No Barriers Identified   Patient Goals and CMS Choice      Discharge Placement  N/A                       Discharge Plan and Services Additional resources added to the After Visit Summary for                                       Social Determinants of Health (SDOH) Interventions SDOH Screenings   Food Insecurity: No Food Insecurity (05/14/2022)  Housing: Low Risk  (05/14/2022)  Transportation Needs: No Transportation Needs (05/14/2022)  Utilities: Not At Risk (05/14/2022)  Tobacco Use: High Risk (05/14/2022)     Readmission Risk Interventions     No data to display

## 2022-05-15 NOTE — Progress Notes (Signed)
Pt had a few heart arrhythmias during my shift, and I informed Hospitalist on call. Gave many medications to manage his pain. Also, received an order for his itching, which may have been a side-effect to the morphine IV. Pt has rested well in the bed since about 0100. Will continue to monitor.

## 2022-05-15 NOTE — Progress Notes (Signed)
CROSS COVER NOTE  NAME: Michael Bonilla MRN: [id number] DOB : Jun 28, 1955 ATTENDING PHYSICIAN: Lorella Nimrod, MD    Date of Service   05/15/2022   HPI/Events of Note   Notified of 15 bt run of VT. Mr Mellinger was asymptomatic and remains hemodynamically stable. K--> 3.6 no recent Mg.  Interventions   Assessment/Plan:  Mg added on to AM lab collection      To reach the provider On-Call:   7AM- 7PM see care teams to locate the attending and reach out to them via [email or web address] Password: [email or web address] 7PM-7AM contact night-coverage If you still have difficulty reaching the appropriate provider, please page the [facility] (Director on Call) for [facility] on amion for assistance  This document was prepared using Systems analyst and may include unintentional dictation errors.  Neomia Glass DNP, MBA, FNP-BC, PMHNP-BC Nurse Practitioner [facility] Pager [phone]

## 2022-05-21 DIAGNOSIS — I7143 Infrarenal abdominal aortic aneurysm, without rupture: Secondary | ICD-10-CM | POA: Diagnosis not present

## 2022-05-21 DIAGNOSIS — K7460 Unspecified cirrhosis of liver: Secondary | ICD-10-CM | POA: Diagnosis not present

## 2022-05-21 DIAGNOSIS — Z09 Encounter for follow-up examination after completed treatment for conditions other than malignant neoplasm: Secondary | ICD-10-CM | POA: Diagnosis not present

## 2022-05-21 DIAGNOSIS — S2222XS Fracture of body of sternum, sequela: Secondary | ICD-10-CM | POA: Diagnosis not present

## 2022-05-22 ENCOUNTER — Encounter (INDEPENDENT_AMBULATORY_CARE_PROVIDER_SITE_OTHER): Payer: Medicare Other

## 2022-05-22 ENCOUNTER — Ambulatory Visit (INDEPENDENT_AMBULATORY_CARE_PROVIDER_SITE_OTHER): Payer: Medicare Other | Admitting: Vascular Surgery

## 2022-06-02 DIAGNOSIS — K7460 Unspecified cirrhosis of liver: Secondary | ICD-10-CM | POA: Insufficient documentation

## 2022-06-02 DIAGNOSIS — R051 Acute cough: Secondary | ICD-10-CM | POA: Diagnosis not present

## 2022-06-02 DIAGNOSIS — J439 Emphysema, unspecified: Secondary | ICD-10-CM | POA: Diagnosis not present

## 2022-06-02 DIAGNOSIS — S2222XS Fracture of body of sternum, sequela: Secondary | ICD-10-CM | POA: Diagnosis not present

## 2022-06-11 DIAGNOSIS — D696 Thrombocytopenia, unspecified: Secondary | ICD-10-CM | POA: Diagnosis not present

## 2022-06-11 DIAGNOSIS — Z1159 Encounter for screening for other viral diseases: Secondary | ICD-10-CM | POA: Diagnosis not present

## 2022-06-11 DIAGNOSIS — S2222XS Fracture of body of sternum, sequela: Secondary | ICD-10-CM | POA: Diagnosis not present

## 2022-09-09 DIAGNOSIS — D696 Thrombocytopenia, unspecified: Secondary | ICD-10-CM | POA: Diagnosis not present

## 2022-09-09 DIAGNOSIS — I25119 Atherosclerotic heart disease of native coronary artery with unspecified angina pectoris: Secondary | ICD-10-CM | POA: Diagnosis not present

## 2022-09-09 DIAGNOSIS — S2222XS Fracture of body of sternum, sequela: Secondary | ICD-10-CM | POA: Diagnosis not present

## 2022-09-30 DIAGNOSIS — R4589 Other symptoms and signs involving emotional state: Secondary | ICD-10-CM | POA: Diagnosis not present

## 2022-09-30 DIAGNOSIS — S2222XS Fracture of body of sternum, sequela: Secondary | ICD-10-CM | POA: Diagnosis not present

## 2022-09-30 DIAGNOSIS — D696 Thrombocytopenia, unspecified: Secondary | ICD-10-CM | POA: Diagnosis not present

## 2022-09-30 DIAGNOSIS — Z1211 Encounter for screening for malignant neoplasm of colon: Secondary | ICD-10-CM | POA: Diagnosis not present

## 2022-09-30 DIAGNOSIS — Z1159 Encounter for screening for other viral diseases: Secondary | ICD-10-CM | POA: Diagnosis not present

## 2022-10-08 ENCOUNTER — Other Ambulatory Visit (INDEPENDENT_AMBULATORY_CARE_PROVIDER_SITE_OTHER): Payer: Self-pay | Admitting: Vascular Surgery

## 2022-10-08 DIAGNOSIS — I714 Abdominal aortic aneurysm, without rupture, unspecified: Secondary | ICD-10-CM

## 2022-10-12 NOTE — Progress Notes (Unsigned)
MRN : [id number]  Michael Bonilla is a 67 y.o. (09-26-1955) male who presents with chief complaint of check circulation.  History of Present Illness:   The patient returns to the office for surveillance of a known abdominal aortic aneurysm. Patient denies abdominal pain or back pain, no other abdominal complaints. No changes suggesting embolic episodes.   There have been no interval changes in the patient's overall health care since his last visit.  Patient denies amaurosis fugax or TIA symptoms. There is no history of claudication or rest pain symptoms of the lower extremities. The patient denies angina or shortness of breath.   Duplex US of the aorta and iliac arteries shows an AAA measured 5.04 cm.  This is an increase in size patient's previous scan was 4.5 cm.  No outpatient medications have been marked as taking for the 10/13/22 encounter (Appointment) with Gilda Crease, Latina Craver, MD.    Past Medical History:  Diagnosis Date   CAD (coronary artery disease)    Cancer (HCC)    Colon   Hyperlipidemia    Hypertension    Myocardial infarction [facility])    Stroke [facility])     Past Surgical History:  Procedure Laterality Date   COLON SURGERY  2012   Partial colectomy    cornary angioplasty   12/08/2005   HERNIA REPAIR     PROSTATE SURGERY  2012   Negative Prostate biopsy    Social History Social History   Tobacco Use   Smoking status: Every Day    Packs/day: .1    Types: Cigarettes   Smokeless tobacco: Never   Tobacco comments:    2-3 cigarettes per day  Vaping Use   Vaping Use: Never used  Substance Use Topics   Alcohol use: Yes    Alcohol/week: 12.0 standard drinks of alcohol    Types: 12 Cans of beer per week   Drug use: No    Family History Family History  Problem Relation Age of Onset   Cancer Mother        brain   Heart disease Father     No Known Allergies   REVIEW OF SYSTEMS  (Negative unless checked)  Constitutional: [] Weight loss  [] Fever  [] Chills Cardiac: [] Chest pain   [] Chest pressure   [] Palpitations   [] Shortness of breath when laying flat   [] Shortness of breath with exertion. Vascular:  [x] Pain in legs with walking   [] Pain in legs at rest  [] History of DVT   [] Phlebitis   [] Swelling in legs   [] Varicose veins   [] Non-healing ulcers Pulmonary:   [] Uses home oxygen   [] Productive cough   [] Hemoptysis   [] Wheeze  [] COPD   [] Asthma Neurologic:  [] Dizziness   [] Seizures   [] History of stroke   [] History of TIA  [] Aphasia   [] Vissual changes   [] Weakness or numbness in arm   [] Weakness or numbness in leg Musculoskeletal:   [] Joint swelling   [] Joint pain   [] Low back pain Hematologic:  [] Easy bruising  [] Easy bleeding   [] Hypercoagulable state   [] Anemic Gastrointestinal:  [] Diarrhea   [] Vomiting  [  x]Gastroesophageal reflux/heartburn   [] Difficulty swallowing. Genitourinary:  [x] Chronic kidney disease   [] Difficult urination  [] Frequent urination   [] Blood in urine Skin:  [] Rashes   [] Ulcers  Psychological:  [] History of anxiety   []  History of major depression.  Physical Examination  There were no vitals filed for this visit. There is no height or weight on file to calculate BMI. Gen: WD/WN, NAD Head: [place]/AT, No temporalis wasting.  Ear/Nose/Throat: Hearing grossly intact, nares w/o erythema or drainage Eyes: PER, EOMI, sclera nonicteric.  Neck: Supple, no masses.  No bruit or JVD.  Pulmonary:  Good air movement, no audible wheezing, no use of accessory muscles.  Cardiac: RRR, normal S1, S2, no Murmurs. Vascular:  mild trophic changes, no open wounds Vessel Right Left  Radial Palpable Palpable  PT Trace palpable Trace palpable  DP Trace palpable Trace palpable  Gastrointestinal: soft, non-distended. No guarding/no peritoneal signs.  Musculoskeletal: M/S 5/5 throughout.  No visible deformity.  Neurologic: CN 2-12 intact. Pain and light touch intact in  extremities.  Symmetrical.  Speech is fluent. Motor exam as listed above. Psychiatric: Judgment intact, Mood & affect appropriate for pt's clinical situation. Dermatologic: No rashes or ulcers noted.  No changes consistent with cellulitis.   CBC Lab Results  Component Value Date   WBC 9.3 05/15/2022   HGB 15.8 05/15/2022   HCT 47.6 05/15/2022   MCV 96.9 05/15/2022   PLT 80 (L) 05/15/2022    BMET    Component Value Date/Time   NA 135 05/15/2022 0150   NA 143 03/04/2017 1013   K 3.6 05/15/2022 0150   CL 100 05/15/2022 0150   CO2 26 05/15/2022 0150   GLUCOSE 112 (H) 05/15/2022 0150   BUN 8 05/15/2022 0150   BUN 9 03/04/2017 1013   CREATININE 1.04 05/15/2022 0150   CALCIUM 8.6 (L) 05/15/2022 0150   GFRNONAA >60 05/15/2022 0150   GFRAA 98 03/04/2017 1013   CrCl cannot be calculated (Patient's most recent lab result is older than the maximum 21 days allowed.).  COAG No results found for: "INR", "PROTIME"  Radiology No results found.   Assessment/Plan 1. Infrarenal abdominal aortic aneurysm (AAA) without rupture (HCC) Recommend:  The patient has an abdominal aortic aneurysm that is 5.0 cm or greater by duplex scan and based on this study it appears that it is suitable for endovascular treatment.  The patient is otherwise in reasonable health.   Therefore, the patient should undergo endovascular repair of the AAA to prevent future leathal rupture.   Patient will require CT angiography of the abdomen and pelvis in order to appropriately plan repair of the AAA.  The risks and benefits as well as the alternative therapies was discussed in detail with the patient. All questions were answered. The patient agrees to move forward the AAA repair.  Therefore a CT angiogram with be scheduled as an outpatient.  The patient will follow up with me in the office after the CT scan to review the study and finalize plan for repair.  2. PAD (peripheral artery disease) (HCC)   Recommend:  The patient has evidence of atherosclerosis of the lower extremities with claudication.  The patient does not voice lifestyle limiting changes at this point in time.  Noninvasive studies do not suggest clinically significant change.  No invasive studies, angiography or surgery at this time The patient should continue walking and begin a more formal exercise program.  The patient should continue antiplatelet therapy and aggressive treatment of the lipid abnormalities  No changes in the patient's medications at this time  Continued surveillance is indicated as atherosclerosis is likely to progress with time.    The patient will continue follow up with noninvasive studies as ordered.   3. Coronary artery disease involving native coronary artery of native heart with angina pectoris (HCC) Continue cardiac and antihypertensive medications as already ordered and reviewed, no changes at this time.  Continue statin as ordered and reviewed, no changes at this time  Nitrates PRN for chest pain  4. Mixed hyperlipidemia Continue statin as ordered and reviewed, no changes at this time  5. Ruptured infrarenal abdominal aortic aneurysm (AAA) (HCC) See #1.  The patient does not have a ruptured aneurysm.  This auto populate's even though preoperative study was checked. - CT ANGIO ABDOMEN PELVIS  W & WO CONTRAST; Future    Levora Dredge, MD  10/12/2022 5:02 PM

## 2022-10-13 ENCOUNTER — Ambulatory Visit (INDEPENDENT_AMBULATORY_CARE_PROVIDER_SITE_OTHER): Payer: Medicare Other | Admitting: Vascular Surgery

## 2022-10-13 ENCOUNTER — Ambulatory Visit (INDEPENDENT_AMBULATORY_CARE_PROVIDER_SITE_OTHER): Payer: Medicare Other

## 2022-10-13 ENCOUNTER — Encounter (INDEPENDENT_AMBULATORY_CARE_PROVIDER_SITE_OTHER): Payer: Self-pay | Admitting: Vascular Surgery

## 2022-10-13 VITALS — BP 156/80 | HR 56 | Resp 18 | Ht 72.0 in | Wt 199.8 lb

## 2022-10-13 DIAGNOSIS — I714 Abdominal aortic aneurysm, without rupture, unspecified: Secondary | ICD-10-CM | POA: Diagnosis not present

## 2022-10-13 DIAGNOSIS — I7143 Infrarenal abdominal aortic aneurysm, without rupture: Secondary | ICD-10-CM | POA: Diagnosis not present

## 2022-10-13 DIAGNOSIS — I739 Peripheral vascular disease, unspecified: Secondary | ICD-10-CM

## 2022-10-13 DIAGNOSIS — I25119 Atherosclerotic heart disease of native coronary artery with unspecified angina pectoris: Secondary | ICD-10-CM | POA: Diagnosis not present

## 2022-10-13 DIAGNOSIS — I7133 Infrarenal abdominal aortic aneurysm, ruptured: Secondary | ICD-10-CM

## 2022-10-13 DIAGNOSIS — E782 Mixed hyperlipidemia: Secondary | ICD-10-CM

## 2022-10-17 ENCOUNTER — Telehealth (INDEPENDENT_AMBULATORY_CARE_PROVIDER_SITE_OTHER): Payer: Self-pay | Admitting: Vascular Surgery

## 2022-10-17 NOTE — Telephone Encounter (Signed)
  LVM for pt to call Radiology scheduling to make an appt for CT scan. I advised to call the office back and schedule a CT results appt with Dr. Gilda Crease.

## 2022-10-28 ENCOUNTER — Ambulatory Visit
Admission: RE | Admit: 2022-10-28 | Discharge: 2022-10-28 | Disposition: A | Discharge status: Home / Self Care | Payer: Medicare Other | Source: Ambulatory Visit | Attending: Vascular Surgery | Admitting: Vascular Surgery

## 2022-10-28 DIAGNOSIS — I7133 Infrarenal abdominal aortic aneurysm, ruptured: Secondary | ICD-10-CM | POA: Insufficient documentation

## 2022-10-28 DIAGNOSIS — I7143 Infrarenal abdominal aortic aneurysm, without rupture: Secondary | ICD-10-CM | POA: Diagnosis not present

## 2022-10-28 DIAGNOSIS — I70202 Unspecified atherosclerosis of native arteries of extremities, left leg: Secondary | ICD-10-CM | POA: Diagnosis not present

## 2022-10-28 DIAGNOSIS — Q272 Other congenital malformations of renal artery: Secondary | ICD-10-CM | POA: Diagnosis not present

## 2022-10-28 DIAGNOSIS — Z01818 Encounter for other preprocedural examination: Secondary | ICD-10-CM | POA: Diagnosis not present

## 2022-10-28 LAB — POCT I-STAT CREATININE: Creatinine, Ser: 1.2 mg/dL (ref 0.61–1.24)

## 2022-10-28 MED ORDER — IOHEXOL 350 MG/ML SOLN
100.0000 mL | Freq: Once | INTRAVENOUS | Status: AC | PRN
  Administered 2022-10-28: 100 mL via INTRAVENOUS

## 2022-11-06 ENCOUNTER — Encounter (INDEPENDENT_AMBULATORY_CARE_PROVIDER_SITE_OTHER): Payer: Self-pay | Admitting: Vascular Surgery

## 2022-11-06 ENCOUNTER — Other Ambulatory Visit: Payer: Self-pay

## 2022-11-06 ENCOUNTER — Ambulatory Visit (INDEPENDENT_AMBULATORY_CARE_PROVIDER_SITE_OTHER): Payer: Medicare Other | Admitting: Vascular Surgery

## 2022-11-06 VITALS — BP 149/70 | HR 64 | Resp 16 | Wt 199.6 lb

## 2022-11-06 DIAGNOSIS — K219 Gastro-esophageal reflux disease without esophagitis: Secondary | ICD-10-CM | POA: Diagnosis not present

## 2022-11-06 DIAGNOSIS — I25119 Atherosclerotic heart disease of native coronary artery with unspecified angina pectoris: Secondary | ICD-10-CM

## 2022-11-06 DIAGNOSIS — E782 Mixed hyperlipidemia: Secondary | ICD-10-CM

## 2022-11-06 DIAGNOSIS — I7143 Infrarenal abdominal aortic aneurysm, without rupture: Secondary | ICD-10-CM | POA: Diagnosis not present

## 2022-11-06 DIAGNOSIS — N181 Chronic kidney disease, stage 1: Secondary | ICD-10-CM | POA: Diagnosis not present

## 2022-11-06 NOTE — Progress Notes (Unsigned)
MRN : [id number]  Michael Bonilla is a 67 y.o. ([birth year]) male who presents with chief complaint of check circulation.  History of Present Illness:   The patient is seen for follow up evaluation of AAA status post CTA. There were no problems or complications related to the CT scan. The patient denies interval development of abdominal or back pain. No new lower extremity pain or discoloration of the toes.   The patient denies recent episodes of angina or shortness of breath. The patient denies interval anaurosis fugax. There is no recent history of TIA symptoms or focal motor deficits. The patient denies PAD or claudication symptoms.   CT angiography of the abdomen and pelvis dated 10/29/2022 shows an infrarenal AAA 4.95 cm.  Previous duplex ultrasound of the aorta iliacs dated 10/13/2022, measures his abdominal aortic aneurysm at 5.04 cm which was increased in size from the previous scan of 4.5.  BUN and creatinine obtained May 21, 2022 BUN 11 creatinine 0.81 with a GFR of greater than 90  No outpatient medications have been marked as taking for the 11/06/22 encounter (Appointment) with Gilda Crease, Latina Craver, MD.    Past Medical History:  Diagnosis Date   CAD (coronary artery disease)    Cancer (HCC)    Colon   Hyperlipidemia    Hypertension    Myocardial infarction [facility])    Stroke [facility])     Past Surgical History:  Procedure Laterality Date   COLON SURGERY  2012   Partial colectomy    cornary angioplasty   12/08/2005   HERNIA REPAIR     PROSTATE SURGERY  2012   Negative Prostate biopsy    Social History Social History   Tobacco Use   Smoking status: Every Day    Current packs/day: 0.10    Types: Cigarettes   Smokeless tobacco: Never   Tobacco comments:    2-3 cigarettes per day  Vaping Use   Vaping status: Never Used  Substance Use Topics   Alcohol use: Yes    Alcohol/week: 12.0 standard  drinks of alcohol    Types: 12 Cans of beer per week   Drug use: No    Family History Family History  Problem Relation Age of Onset   Cancer Mother        brain   Heart disease Father     No Known Allergies   REVIEW OF SYSTEMS (Negative unless checked)  Constitutional: [] Weight loss  [] Fever  [] Chills Cardiac: [] Chest pain   [] Chest pressure   [] Palpitations   [] Shortness of breath when laying flat   [] Shortness of breath with exertion. Vascular:  [x] Pain in legs with walking   [] Pain in legs at rest  [] History of DVT   [] Phlebitis   [] Swelling in legs   [] Varicose veins   [] Non-healing ulcers Pulmonary:   [] Uses home oxygen   [] Productive cough   [] Hemoptysis   [] Wheeze  [] COPD   [] Asthma Neurologic:  [] Dizziness   [] Seizures   [] History of stroke   [] History of TIA  [] Aphasia   [] Vissual changes   [] Weakness or numbness in arm   []   Weakness or numbness in leg Musculoskeletal:   [] Joint swelling   [] Joint pain   [] Low back pain Hematologic:  [] Easy bruising  [] Easy bleeding   [] Hypercoagulable state   [] Anemic Gastrointestinal:  [] Diarrhea   [] Vomiting  [] Gastroesophageal reflux/heartburn   [] Difficulty swallowing. Genitourinary:  [] Chronic kidney disease   [] Difficult urination  [] Frequent urination   [] Blood in urine Skin:  [] Rashes   [] Ulcers  Psychological:  [] History of anxiety   []  History of major depression.  Physical Examination  There were no vitals filed for this visit. There is no height or weight on file to calculate BMI. Gen: WD/WN, NAD Head: /AT, No temporalis wasting.  Ear/Nose/Throat: Hearing grossly intact, nares w/o erythema or drainage Eyes: PER, EOMI, sclera nonicteric.  Neck: Supple, no masses.  No bruit or JVD.  Pulmonary:  Good air movement, no audible wheezing, no use of accessory muscles.  Cardiac: RRR, normal S1, S2, no Murmurs. Vascular:  mild trophic changes, no open wounds Vessel Right Left  Radial Palpable Palpable  PT Not Palpable Not  Palpable  DP Not Palpable Not Palpable  Gastrointestinal: soft, non-distended. No guarding/no peritoneal signs.  Musculoskeletal: M/S 5/5 throughout.  No visible deformity.  Neurologic: CN 2-12 intact. Pain and light touch intact in extremities.  Symmetrical.  Speech is fluent. Motor exam as listed above. Psychiatric: Judgment intact, Mood & affect appropriate for pt's clinical situation. Dermatologic: No rashes or ulcers noted.  No changes consistent with cellulitis.   CBC Lab Results  Component Value Date   WBC 9.3 05/15/2022   HGB 15.8 05/15/2022   HCT 47.6 05/15/2022   MCV 96.9 05/15/2022   PLT 80 (L) 05/15/2022    BMET    Component Value Date/Time   NA 135 05/15/2022 0150   NA 143 03/04/2017 1013   K 3.6 05/15/2022 0150   CL 100 05/15/2022 0150   CO2 26 05/15/2022 0150   GLUCOSE 112 (H) 05/15/2022 0150   BUN 8 05/15/2022 0150   BUN 9 03/04/2017 1013   CREATININE 1.20 10/28/2022 1344   CALCIUM 8.6 (L) 05/15/2022 0150   GFRNONAA >60 05/15/2022 0150   GFRAA 98 03/04/2017 1013   CrCl cannot be calculated (Unknown ideal weight.).  COAG No results found for: "INR", "PROTIME"  Radiology CT ANGIO ABDOMEN PELVIS  W & WO CONTRAST  Result Date: 10/29/2022 CLINICAL DATA:  Abdominal aortic aneurysm.  Pre-operative planning. EXAM: CTA ABDOMEN AND PELVIS WITHOUT AND WITH CONTRAST TECHNIQUE: Multidetector CT imaging of the abdomen and pelvis was performed using the standard protocol during bolus administration of intravenous contrast. Multiplanar reconstructed images and MIPs were obtained and reviewed to evaluate the vascular anatomy. RADIATION DOSE REDUCTION: This exam was performed according to the departmental dose-optimization program which includes automated exposure control, adjustment of the mA and/or kV according to patient size and/or use of iterative reconstruction technique. CONTRAST:  OMNIPAQUE IOHEXOL 350 MG/ML SOLN COMPARISON:  05/14/2022 FINDINGS: VASCULAR Aorta:  Normal caliber of the distal descending thoracic aorta. Hourglass shaped infrarenal abdominal aorta originating just below the inferior accessory right renal artery. Large amount of mural thrombus associated with this aortic aneurysm particularly in the distal aspect. Maximum dimension of the aortic aneurysm is 4.8 cm on image 117/4 and previously measured up to 4.6 cm based on re-measurement. Findings suggestive for mild interval enlargement. The overall morphology and configuration of the aneurysm is unchanged. Aneurysm terminates just above the aortic bifurcation. Celiac: Patent without evidence of aneurysm, dissection, vasculitis or significant stenosis. SMA: Patent without  evidence of aneurysm, dissection, vasculitis or significant stenosis. Renals: Single left renal artery is patent without aneurysm, dissection or significant stenosis. Main right renal artery is patent without aneurysm, dissection or significant stenosis. Small inferior accessory renal artery supplying the lower pole is located just above the aortic aneurysm. IMA: Patent Inflow: Diffuse atherosclerotic calcifications involving bilateral iliac arteries. Large amount of irregular calcified plaque in the mid left common iliac artery that is likely hemodynamically significant. Maximum dimension of the left common iliac artery measures 1.1 cm. Focal dilatation involving the left internal iliac artery measures 0.8 cm. Diffuse scattered calcified plaque involving the bilateral external iliac arteries. At least mild stenosis at the origin of the right internal iliac artery. Proximal Outflow: Right common femoral artery is patent. Again noted is occlusion of the right superficial femoral artery near the origin. Right profunda femoral arteries are patent. Left common femoral artery is patent with prominent calcified plaque and concern for high-grade stenosis in the distal left common femoral artery. Left profunda femoral arteries are patent. There  appears to be a short segment occlusion in the proximal left SFA with distal reconstitution. Veins: Portal venous system is patent. Common iliac veins and IVC are patent. Normal appearance of the renal veins. Review of the MIP images confirms the above findings. NON-VASCULAR Lower chest: Chronic subpleural reticular densities in the right lower lobe. Subtle peripheral and subpleural densities in the lateral left lung base which may be chronic. No pleural effusions. Hepatobiliary: Calcified gallstone at the base of the gallbladder. No gallbladder distension or inflammatory changes. Previous CT interpretation raised concern for cirrhotic morphology. Difficult to exclude mild nodularity in liver. No discrete liver lesion identified. Pancreas: Unremarkable. No pancreatic ductal dilatation or surrounding inflammatory changes. Spleen: Normal in size without focal abnormality. Adrenals/Urinary Tract: Normal adrenal glands. Negative for hydronephrosis. No suspicious renal lesions. Chronic mild perinephric stranding. Probable bladder wall thickening that appears chronic but limited evaluation on this examination. Stomach/Bowel: Multiple colonic diverticula particularly in the sigmoid colon region. No acute bowel inflammation. Evidence for right hemicolectomy. No bowel dilatation or obstruction. Stomach is mildly distended with large amount of gastric contents. Again noted is a large duodenal diverticulum or diverticula near the pancreatic head. Lymphatic: No lymph node enlargement in the abdomen or pelvis. Reproductive: Prostate enlargement. Prostate measures 5.1 cm in the transverse dimension. Other: Negative for free air. Negative for free fluid. There is surgical mesh material in the right anterior lower abdomen and right inguinal region. Again noted is some fat in the right inguinal canal which is similar to the previous examination. Evidence for small left inguinal hernia containing fat. Small umbilical hernia.  Musculoskeletal: Stable heterogeneity involving the posterior iliac bones. Grade 1 anterolisthesis of L4 on L5. IMPRESSION: VASCULAR 1. Abdominal aortic aneurysm has slightly enlarged since 05/14/2022. Infrarenal abdominal aortic aneurysm measures up to 4.8 cm and previously measured 4.6 cm. Morphology of the aneurysm is unchanged. 2. Left inflow disease. Stenosis in left common iliac artery due to irregular calcified plaque. 3. Right outflow disease. Occlusion of the right superficial femoral artery at the origin. 4. Left outflow disease. Left common femoral artery stenosis with short segment occlusion or high-grade stenosis in the proximal left SFA. 5. Accessory right renal artery just above the abdominal aortic aneurysm. NON-VASCULAR 1. No acute abnormality in the abdomen or pelvis. 2. Extensive colonic diverticulosis without acute inflammation. There is also a large duodenal diverticulum/diverticula. 3. Postsurgical changes in the abdomen as described. 4. Probable bladder wall thickening and may be  associated with prostate enlargement. 5. Cholelithiasis. Electronically Signed   By: Richarda Overlie M.D.   On: 10/29/2022 15:41   VAS [place] AAA DUPLEX  Result Date: 10/14/2022 ABDOMINAL AORTA STUDY Patient Name:  Michael Bonilla [facility]  Date of Exam:   10/13/2022 Medical Rec #: [id number]         Accession #:    [id number] Date of Birth: 1956-01-05        Patient Gender: M Patient Age:   18 years Exam Location:  [facility] Procedure:      VAS [place] AAA DUPLEX Referring Phys: Levora Dredge --------------------------------------------------------------------------------  Indications: Follow up exam for known AAA.  Comparison Study: 11/12/2021 CT abd AAA distal 4.5cm Performing Technologist: Salvadore Farber RVT  Examination Guidelines: A complete evaluation includes B-mode imaging, spectral Doppler, color Doppler, and power Doppler as needed of all accessible portions of each vessel. Bilateral testing is considered an  integral part of a complete examination. Limited examinations for reoccurring indications may be performed as noted.  Abdominal Aorta Findings: +-----------+-------+----------+----------+--------+--------+--------+ Location   AP (cm)Trans (cm)PSV (cm/s)WaveformThrombusComments +-----------+-------+----------+----------+--------+--------+--------+ Proximal   1.81   1.64      66                                 +-----------+-------+----------+----------+--------+--------+--------+ Mid        2.89   2.73      53                                 +-----------+-------+----------+----------+--------+--------+--------+ Distal     4.42   5.04      52                                 +-----------+-------+----------+----------+--------+--------+--------+ RT CIA Prox1.1    1.2       78                                 +-----------+-------+----------+----------+--------+--------+--------+ LT CIA Prox1.2    1.1       94                                 +-----------+-------+----------+----------+--------+--------+--------+  Summary: Abdominal Aorta: There is evidence of abnormal dilatation of the mid and distal Abdominal aorta. The largest aortic measurement is 5.0 cm. Two areas of dilitation is seen in the abdominal aorta. The mid aorta is dilated to 2.89cm and then tapers down in mid to distal aorta. Then the distal aorta shows a 4.42 x 5.04cm AAA whic is slightly larger than the CT measurements at 4.5cm on 10/2021. Bilateral CIA's are of normal caliber with moderate atherosclerosis seen. Moderate thrombus is seen in the AAA with 11% patent lumen remaining by eliptical area measurements. The largest aortic diameter has increased compared to prior exam. Previous diameter measurement was 4.5 cm obtained on 10/2021.  *See table(s) above for measurements and observations.  Electronically signed by Levora Dredge MD on 10/14/2022 at 10:34:00 AM.    Final      Assessment/Plan There are no  diagnoses linked to this encounter.   Levora Dredge, MD  11/06/2022 8:38 AM

## 2022-11-11 ENCOUNTER — Ambulatory Visit
Admission: RE | Admit: 2022-11-11 | Discharge: 2022-11-11 | Disposition: A | Discharge status: Home / Self Care | Payer: Medicare Other | Source: Ambulatory Visit | Attending: Cardiology | Admitting: Cardiology

## 2022-11-11 DIAGNOSIS — I25119 Atherosclerotic heart disease of native coronary artery with unspecified angina pectoris: Secondary | ICD-10-CM | POA: Diagnosis not present

## 2022-11-11 LAB — NM MYOCAR MULTI W/SPECT W/WALL MOTION / EF
LV dias vol: 190 mL (ref 62–150)
LV sys vol: 163 mL
Nuc Stress EF: 14 %
Peak HR: 85 {beats}/min
Rest HR: 58 {beats}/min
Rest Nuclear Isotope Dose: 9.5 mCi
SDS: 0
SRS: 40
SSS: 22
ST Depression (mm): 0 mm
Stress Nuclear Isotope Dose: 31.1 mCi
TID: 0.83

## 2022-11-11 MED ORDER — TECHNETIUM TC 99M TETROFOSMIN IV KIT
31.0800 | PACK | Freq: Once | INTRAVENOUS | Status: AC | PRN
  Administered 2022-11-11: 31.08 via INTRAVENOUS

## 2022-11-11 MED ORDER — REGADENOSON 0.4 MG/5ML IV SOLN
0.4000 mg | Freq: Once | INTRAVENOUS | Status: AC
  Administered 2022-11-11: 0.4 mg via INTRAVENOUS
  Filled 2022-11-11: qty 5

## 2022-11-11 MED ORDER — TECHNETIUM TC 99M TETROFOSMIN IV KIT
9.5300 | PACK | Freq: Once | INTRAVENOUS | Status: AC | PRN
  Administered 2022-11-11: 9.53 via INTRAVENOUS

## 2022-11-12 ENCOUNTER — Encounter (INDEPENDENT_AMBULATORY_CARE_PROVIDER_SITE_OTHER): Payer: Self-pay | Admitting: Vascular Surgery

## 2022-11-21 ENCOUNTER — Ambulatory Visit: Payer: Medicare Other | Attending: Cardiology

## 2022-11-21 DIAGNOSIS — I25119 Atherosclerotic heart disease of native coronary artery with unspecified angina pectoris: Secondary | ICD-10-CM | POA: Diagnosis not present

## 2022-11-21 DIAGNOSIS — I77810 Thoracic aortic ectasia: Secondary | ICD-10-CM

## 2022-11-21 HISTORY — DX: Thoracic aortic ectasia: I77.810

## 2022-11-21 LAB — ECHOCARDIOGRAM COMPLETE
AR max vel: 3.84 cm2
AV Area VTI: 3.63 cm2
AV Area mean vel: 3.61 cm2
AV Mean grad: 2 mmHg
AV Peak grad: 4.2 mmHg
Ao pk vel: 1.03 m/s
Area-P 1/2: 2.99 cm2
Calc EF: 32.5 %
S' Lateral: 5.2 cm
Single Plane A2C EF: 29 %
Single Plane A4C EF: 35.2 %

## 2022-11-24 MED ORDER — ENTRESTO 24-26 MG PO TABS: 1.0000 | ORAL_TABLET | Freq: Two times a day (BID) | ORAL | 3 refills | Status: DC

## 2022-11-24 MED ORDER — CARVEDILOL 12.5 MG PO TABS: 12.5000 mg | ORAL_TABLET | Freq: Two times a day (BID) | ORAL | 3 refills | Status: DC

## 2022-12-03 ENCOUNTER — Other Ambulatory Visit: Payer: Medicare Other

## 2022-12-04 ENCOUNTER — Other Ambulatory Visit: Payer: Medicare Other
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
## 2022-12-09 ENCOUNTER — Encounter: Payer: Self-pay | Admitting: Physician Assistant

## 2022-12-09 ENCOUNTER — Ambulatory Visit: Payer: Medicare Other | Attending: Physician Assistant | Admitting: Physician Assistant

## 2022-12-09 VITALS — BP 122/70 | HR 53 | Ht 72.0 in | Wt 200.0 lb

## 2022-12-09 DIAGNOSIS — I10 Essential (primary) hypertension: Secondary | ICD-10-CM

## 2022-12-09 DIAGNOSIS — Z01810 Encounter for preprocedural cardiovascular examination: Secondary | ICD-10-CM

## 2022-12-09 DIAGNOSIS — I2510 Atherosclerotic heart disease of native coronary artery without angina pectoris: Secondary | ICD-10-CM

## 2022-12-09 DIAGNOSIS — I7143 Infrarenal abdominal aortic aneurysm, without rupture: Secondary | ICD-10-CM | POA: Diagnosis not present

## 2022-12-09 DIAGNOSIS — I5020 Unspecified systolic (congestive) heart failure: Secondary | ICD-10-CM

## 2022-12-09 DIAGNOSIS — E785 Hyperlipidemia, unspecified: Secondary | ICD-10-CM

## 2022-12-09 DIAGNOSIS — I739 Peripheral vascular disease, unspecified: Secondary | ICD-10-CM

## 2022-12-09 NOTE — Patient Instructions (Signed)
Medication Instructions:  Your physician recommends that you continue on your current medications as directed. Please refer to the Current Medication list given to you today.   *If you need a refill on your cardiac medications before your next appointment, please call your pharmacy*   Lab Work: No labs ordered today    Testing/Procedures: No test ordered today    Follow-Up: At [facility], you and your health needs are our priority.  As part of our continuing mission to provide you with exceptional heart care, we have created designated Provider Care Teams.  These Care Teams include your primary Cardiologist (physician) and Advanced Practice Providers (APPs -  Physician Assistants and Nurse Practitioners) who all work together to provide you with the care you need, when you need it.  We recommend signing up for the patient portal called "MyChart".  Sign up information is provided on this After Visit Summary.  MyChart is used to connect with patients for Virtual Visits (Telemedicine).  Patients are able to view lab/test results, encounter notes, upcoming appointments, etc.  Non-urgent messages can be sent to your provider as well.   To learn more about what you can do with MyChart, go to [email or web address].    Your next appointment:   2 month(s)  Provider:   You may see Debbe Odea, MD or one of the following Advanced Practice Providers on your designated Care Team:   Nicolasa Ducking, NP Eula Listen, PA-C Cadence Fransico Michael, PA-C Charlsie Quest, NP

## 2022-12-16 ENCOUNTER — Telehealth (INDEPENDENT_AMBULATORY_CARE_PROVIDER_SITE_OTHER): Payer: Self-pay

## 2022-12-16 NOTE — Telephone Encounter (Signed)
Spoke with the patient and he is scheduled with Dr. Gilda Crease for a AAA repair on 12/24/22 with a 9:45 am arrival time to the [facility]. Pre-op is scheduled on 12/19/22 at 8:00 am at the [facility]. Pre-surgical instructions were discussed and will be sent to Mychart and mailed.

## 2022-12-18 ENCOUNTER — Other Ambulatory Visit (INDEPENDENT_AMBULATORY_CARE_PROVIDER_SITE_OTHER): Payer: Self-pay | Admitting: Nurse Practitioner

## 2022-12-18 DIAGNOSIS — I7143 Infrarenal abdominal aortic aneurysm, without rupture: Secondary | ICD-10-CM

## 2022-12-19 ENCOUNTER — Other Ambulatory Visit: Payer: Medicare Other

## 2022-12-19 ENCOUNTER — Encounter
Admission: RE | Admit: 2022-12-19 | Discharge: 2022-12-19 | Disposition: A | Discharge status: Home / Self Care | Payer: Medicare Other | Source: Ambulatory Visit | Attending: Vascular Surgery | Admitting: Vascular Surgery

## 2022-12-19 VITALS — BP 128/80 | HR 58 | Resp 14 | Ht 72.0 in | Wt 200.6 lb

## 2022-12-19 DIAGNOSIS — D696 Thrombocytopenia, unspecified: Secondary | ICD-10-CM | POA: Diagnosis not present

## 2022-12-19 DIAGNOSIS — I25119 Atherosclerotic heart disease of native coronary artery with unspecified angina pectoris: Secondary | ICD-10-CM | POA: Diagnosis not present

## 2022-12-19 DIAGNOSIS — I7143 Infrarenal abdominal aortic aneurysm, without rupture: Secondary | ICD-10-CM | POA: Diagnosis not present

## 2022-12-19 DIAGNOSIS — Z01818 Encounter for other preprocedural examination: Secondary | ICD-10-CM

## 2022-12-19 DIAGNOSIS — Z01812 Encounter for preprocedural laboratory examination: Secondary | ICD-10-CM | POA: Insufficient documentation

## 2022-12-19 HISTORY — DX: Bradycardia, unspecified: R00.1

## 2022-12-19 HISTORY — DX: Thrombocytopenia, unspecified: D69.6

## 2022-12-19 HISTORY — DX: Tobacco use: Z72.0

## 2022-12-19 HISTORY — DX: Personal history of (healed) traumatic fracture: Z87.81

## 2022-12-19 HISTORY — DX: Abnormal levels of other serum enzymes: R74.8

## 2022-12-19 HISTORY — DX: Fracture of body of sternum, initial encounter for closed fracture: S22.22XA

## 2022-12-19 HISTORY — DX: Anxiety disorder, unspecified: F41.9

## 2022-12-19 HISTORY — DX: Alcohol abuse, uncomplicated: F10.10

## 2022-12-19 HISTORY — DX: Peripheral vascular disease, unspecified: I73.9

## 2022-12-19 HISTORY — DX: Cardiomyopathy, unspecified: I42.9

## 2022-12-19 HISTORY — DX: Elevated prostate specific antigen (PSA): R97.20

## 2022-12-19 HISTORY — DX: Abdominal aortic aneurysm, without rupture, unspecified: I71.40

## 2022-12-19 HISTORY — DX: Gastro-esophageal reflux disease without esophagitis: K21.9

## 2022-12-19 HISTORY — DX: Polyneuropathy, unspecified: G62.9

## 2022-12-19 HISTORY — DX: Chronic kidney disease, stage 1: N18.1

## 2022-12-19 HISTORY — DX: Ventricular fibrillation: I49.01

## 2022-12-19 LAB — COMPREHENSIVE METABOLIC PANEL
ALT: 29 U/L (ref 0–44)
AST: 34 U/L (ref 15–41)
Albumin: 4 g/dL (ref 3.5–5.0)
Alkaline Phosphatase: 91 U/L (ref 38–126)
Anion gap: 11 (ref 5–15)
BUN: 9 mg/dL (ref 8–23)
CO2: 23 mmol/L (ref 22–32)
Calcium: 8.9 mg/dL (ref 8.9–10.3)
Chloride: 102 mmol/L (ref 98–111)
Creatinine, Ser: 0.95 mg/dL (ref 0.61–1.24)
GFR, Estimated: >60 mL/min (ref >60)
Glucose, Bld: 135 mg/dL — ABNORMAL HIGH (ref 70–99)
Potassium: 3.9 mmol/L (ref 3.5–5.1)
Sodium: 136 mmol/L (ref 135–145)
Total Bilirubin: 0.9 mg/dL (ref 0.3–1.2)
Total Protein: 7.1 g/dL (ref 6.5–8.1)

## 2022-12-19 LAB — CBC
HCT: 43.6 % (ref 39.0–52.0)
Hemoglobin: 14.9 g/dL (ref 13.0–17.0)
MCH: 32.1 pg (ref 26.0–34.0)
MCHC: 34.2 g/dL (ref 30.0–36.0)
MCV: 94 fL (ref 80.0–100.0)
Platelets: 88 10*3/uL — ABNORMAL LOW (ref 150–400)
RBC: 4.64 MIL/uL (ref 4.22–5.81)
RDW: 12.8 % (ref 11.5–15.5)
WBC: 7.2 10*3/uL (ref 4.0–10.5)
nRBC: 0 % (ref 0.0–0.2)

## 2022-12-19 LAB — TECHNOLOGIST SMEAR REVIEW: Plt Morphology: NORMAL

## 2022-12-19 LAB — SURGICAL PCR SCREEN
MRSA, PCR: NEGATIVE
Staphylococcus aureus: NEGATIVE

## 2022-12-19 NOTE — Patient Instructions (Addendum)
Your procedure is scheduled on:12-24-22 Wednesday Report to the [facility] @ 9:45 am  REMEMBER: Instructions that are not followed completely may result in serious medical risk, up to and including death; or upon the discretion of your surgeon and anesthesiologist your surgery may need to be rescheduled.  Do not eat food OR drink any liquids after midnight the night before surgery.  No gum chewing or hard candies.  One week prior to surgery:Stop NOW (12-19-22) Stop Anti-inflammatories (NSAIDS) such as Advil, Aleve, Ibuprofen, Motrin, Naproxen, Naprosyn and Aspirin based products such as Excedrin, Goody's Powder, BC Powder.You may however, take Tylenol if needed for pain up until the day of surgery. Stop ANY OVER THE COUNTER supplements/vitamins NOW (12-19-22) until after surgery (Fish Oil   Continue taking all prescribed medications  TAKE ONLY THESE MEDICATIONS THE MORNING OF SURGERY WITH A SIP OF WATER: -busPIRone (BUSPAR)  -carvedilol (COREG)  -gabapentin (NEURONTIN)  -omeprazole (PRILOSEC)-take one the night before and one on the morning of surgery - helps to prevent nausea after surgery.)  Continue your 81 mg Aspirin up until the day prior to surgery-Do NOT take the morning of surgery  No Alcohol for 24 hours before or after surgery.  No Smoking including e-cigarettes for 24 hours before surgery.  No chewable tobacco products for at least 6 hours before surgery.  No nicotine patches on the day of surgery.  Do not use any "recreational" drugs for at least a week (preferably 2 weeks) before your surgery.  Please be advised that the combination of cocaine and anesthesia may have negative outcomes, up to and including death. If you test positive for cocaine, your surgery will be cancelled.  On the morning of surgery brush your teeth with toothpaste and water, you may rinse your mouth with mouthwash if you wish. Do not swallow any toothpaste or mouthwash.  Use CHG Soap  as directed on instruction sheet.  Do not wear jewelry, make-up, hairpins, clips or nail polish.  Do not wear lotions, powders, or perfumes.   Do not shave body hair from the neck down 48 hours before surgery.  Contact lenses, hearing aids and dentures may not be worn into surgery.  Do not bring valuables to the hospital. [facility] is not responsible for any missing/lost belongings or valuables.  Notify your doctor if there is any change in your medical condition (cold, fever, infection).  Wear comfortable clothing (specific to your surgery type) to the hospital.  After surgery, you can help prevent lung complications by doing breathing exercises.  Take deep breaths and cough every 1-2 hours. Your doctor may order a device called an Incentive Spirometer to help you take deep breaths. When coughing or sneezing, hold a pillow firmly against your incision with both hands. This is called "splinting." Doing this helps protect your incision. It also decreases belly discomfort.  If you are being admitted to the hospital overnight, leave your suitcase in the car. After surgery it may be brought to your room.  In case of increased patient census, it may be necessary for you, the patient, to continue your postoperative care in the Same Day Surgery department.  If you are being discharged the day of surgery, you will not be allowed to drive home. You will need a responsible individual to drive you home and stay with you for 24 hours after surgery.   If you are taking public transportation, you will need to have a responsible individual with you.  Please call the  Pre-admissions Testing Dept. at [phone] if you have any questions about these instructions.  Surgery Visitation Policy:  Patients having surgery or a procedure may have two visitors.  Children under the age of 50 must have an adult with them who is not the patient.  Inpatient Visitation:    Visiting hours are 7 a.m. to 8  p.m. Up to four visitors are allowed at one time in a patient room. The visitors may rotate out with other people during the day.  One visitor age 27 or older may stay with the patient overnight and must be in the room by 8 p.m.     Preparing for Surgery with CHLORHEXIDINE GLUCONATE (CHG) Soap  Chlorhexidine Gluconate (CHG) Soap  o An antiseptic cleaner that kills germs and bonds with the skin to continue killing germs even after washing  o Used for showering the night before surgery and morning of surgery  Before surgery, you can play an important role by reducing the number of germs on your skin.  CHG (Chlorhexidine gluconate) soap is an antiseptic cleanser which kills germs and bonds with the skin to continue killing germs even after washing.  Please do not use if you have an allergy to CHG or antibacterial soaps. If your skin becomes reddened/irritated stop using the CHG.  1. Shower the NIGHT BEFORE SURGERY and the MORNING OF SURGERY with CHG soap.  2. If you choose to wash your hair, wash your hair first as usual with your normal shampoo.  3. After shampooing, rinse your hair and body thoroughly to remove the shampoo.  4. Use CHG as you would any other liquid soap. You can apply CHG directly to the skin and wash gently with a scrungie or a clean washcloth.  5. Apply the CHG soap to your body only from the neck down. Do not use on open wounds or open sores. Avoid contact with your eyes, ears, mouth, and genitals (private parts). Wash face and genitals (private parts) with your normal soap.  6. Wash thoroughly, paying special attention to the area where your surgery will be performed.  7. Thoroughly rinse your body with warm water.  8. Do not shower/wash with your normal soap after using and rinsing off the CHG soap.  9. Pat yourself dry with a clean towel.  10. Wear clean pajamas to bed the night before surgery.  12. Place clean sheets on your bed the night of your first  shower and do not sleep with pets.  13. Shower again with the CHG soap on the day of surgery prior to arriving at the hospital.  14. Do not apply any deodorants/lotions/powders.  15. Please wear clean clothes to the hospital.

## 2022-12-19 NOTE — Progress Notes (Signed)
  [facility] Perioperative Services: Pre-Admission/Anesthesia Testing  Abnormal Lab Notification and Plans for Care Coordination   Date: 12/19/22  Name: Michael Bonilla MRN:   [id number]  Re: Abnormal labs noted during PAT appointment   Notified:    Provider Name Provider Role Notification Mode  Schnier, Latina Craver, MD Vascular Surgery Routed and/or faxed via Tollie Pizza, FNP-C Vascular Surgery APP Routed and/or faxed via [facility]   ABNORMAL LAB VALUE(S):   Lab Results  Component Value Date   PLT 88 (L) 12/19/2022   Clinical Information and Notes:  Michael Bonilla is scheduled for a ENDOVASCULAR REPAIR/STENT GRAFT on 12/24/2022. Patient thrombocytopenic for many years, with platelet counts ranging from the 110s - 130s K/uL.  In January 2024, patient with worsening thrombocytopenia, with counts as low as 80 K/uL. PCP felt as if elevations were due to hepatic cirrhosis, as patient consumes ETOH (3-4 beers/day; "more if a hot day") on a daily basis. He has been noted to have mild-moderate transaminitis in the past. Consideration of hepatology referral was discussed with patient by PCP,  however I do not see documentation to suggest that patient has been seen in consult.   With that said, patient's LFTs are completely normal today. CT in 10/2022 revealed a normal splenic size, thus making the possibility of sequestration less likely. With the prolonged low platelet counts, it is prudent to include a potential primary underlying myelodysplastic syndrome (ITP) as part of the differential. Patient has never been seen in consult by hematology for his thrombocytopenia. Peripheral smear review added today that revealed normal platelet morphology with no evidence of clumping. Currently, we do not have the ability to perform citrated platelet count here at [facility].   Given patient's upcoming AAA repair, patient is at increased risk for  procedural bleeding. In the setting of infrarenal AAA that is > 5.0 cm, delaying case could lead to deleterious outcomes for the patient. This in mind, procedural complications (rupture, bleeding) could also be detrimental.  Case discussed with attending anesthesiologist Lorette Ang, MD). Aforementioned case history reviewed. MD conferred with anesthesia partners and the consensus was that we ask that the patient be seen in consult by hematology prior to upcoming surgery. I will plan on sending the urgent referral today, as again, we do not want to delay the patient's procedure anymore than possible. Will send copy of note to surgeon and his APP to make them aware. Case will need to be postponed pending hematology consult  Quentin Mulling, MSN, APRN, FNP-C, CEN [facility]  Peri-operative Services Nurse Practitioner Phone: [phone] Fax: [phone] Email: [email or web address] 12/19/22 2:56 PM

## 2022-12-20 LAB — TYPE AND SCREEN
ABO/RH(D): A POS
Antibody Screen: NEGATIVE

## 2022-12-23 DIAGNOSIS — D696 Thrombocytopenia, unspecified: Secondary | ICD-10-CM | POA: Insufficient documentation
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
## 2022-12-23 NOTE — Assessment & Plan Note (Addendum)
#   Thrombocytopenia: Mild/moderate [2016-1320-130; JAN-AUG 2024- 80s]; normal Hb/ WBC.  The etiology is unclear.  I do long discussion with patient regarding multiple etiologies including but not limited to liver disease/medications/autoimmune disease-ITP etc. less likely but possible malignant causes.  CT scan AP- JULY 2024-  Hepatobiliary: Calcified gallstone at the base of the gallbladder. No gallbladder distension or inflammatory changes. Previous CT interpretation raised concern for cirrhotic morphology. Difficult to exclude mild nodularity in liver. No discrete liver lesion identified. Spleen- normal in size.   # Discussed that ITP is a diagnosis of exclusion.  ITP is usually treated with steroids.  Hold off bone marrow biopsy-given the need for upcoming surgery and as platelets are not terribly low.  Thank you for allowing me to participate in the care of your pleasant patient. Please do not hesitate to contact me with questions or concerns in the interim.   # DISPOSITION: # labs today- ordered- # follow up in 1 week- MD; no labs- Dr.B  Addendum: Patient's repeat platelets 92; rest of the CBC normal.  Platelet immature fraction elevated.-Will plan Decadron 40 mg daily x 4.  Patient does not have a history of diabetes; no prior reaction to prednisone.  Discussed with Dr. Gilda Crease with vascular surgery; and also. preop.  Patient follow-up in 1 week-will repeat labs.

## 2022-12-24 ENCOUNTER — Inpatient Hospital Stay: Payer: Medicare Other | Attending: Internal Medicine | Admitting: Internal Medicine

## 2022-12-24 ENCOUNTER — Other Ambulatory Visit: Payer: Self-pay

## 2022-12-24 ENCOUNTER — Encounter: Payer: Self-pay | Admitting: Internal Medicine

## 2022-12-24 ENCOUNTER — Inpatient Hospital Stay: Payer: Medicare Other

## 2022-12-24 VITALS — BP 145/70 | HR 64 | Temp 97.0°F | Ht 72.0 in | Wt 201.0 lb

## 2022-12-24 DIAGNOSIS — Z808 Family history of malignant neoplasm of other organs or systems: Secondary | ICD-10-CM | POA: Insufficient documentation

## 2022-12-24 DIAGNOSIS — E785 Hyperlipidemia, unspecified: Secondary | ICD-10-CM | POA: Insufficient documentation

## 2022-12-24 DIAGNOSIS — I252 Old myocardial infarction: Secondary | ICD-10-CM | POA: Diagnosis not present

## 2022-12-24 DIAGNOSIS — D696 Thrombocytopenia, unspecified: Secondary | ICD-10-CM

## 2022-12-24 DIAGNOSIS — I714 Abdominal aortic aneurysm, without rupture, unspecified: Secondary | ICD-10-CM | POA: Diagnosis not present

## 2022-12-24 DIAGNOSIS — Z79899 Other long term (current) drug therapy: Secondary | ICD-10-CM | POA: Insufficient documentation

## 2022-12-24 DIAGNOSIS — Z8673 Personal history of transient ischemic attack (TIA), and cerebral infarction without residual deficits: Secondary | ICD-10-CM | POA: Insufficient documentation

## 2022-12-24 DIAGNOSIS — N181 Chronic kidney disease, stage 1: Secondary | ICD-10-CM | POA: Insufficient documentation

## 2022-12-24 DIAGNOSIS — Z8249 Family history of ischemic heart disease and other diseases of the circulatory system: Secondary | ICD-10-CM | POA: Diagnosis not present

## 2022-12-24 DIAGNOSIS — I2510 Atherosclerotic heart disease of native coronary artery without angina pectoris: Secondary | ICD-10-CM | POA: Insufficient documentation

## 2022-12-24 DIAGNOSIS — F17210 Nicotine dependence, cigarettes, uncomplicated: Secondary | ICD-10-CM | POA: Insufficient documentation

## 2022-12-24 DIAGNOSIS — Z7952 Long term (current) use of systemic steroids: Secondary | ICD-10-CM | POA: Insufficient documentation

## 2022-12-24 LAB — CBC WITH DIFFERENTIAL/PLATELET
Abs Immature Granulocytes: 0.03 10*3/uL (ref 0.00–0.07)
Basophils Absolute: 0.1 10*3/uL (ref 0.0–0.1)
Basophils Relative: 1 %
Eosinophils Absolute: 0.1 10*3/uL (ref 0.0–0.5)
Eosinophils Relative: 1 %
HCT: 47.7 % (ref 39.0–52.0)
Hemoglobin: 15.9 g/dL (ref 13.0–17.0)
Immature Granulocytes: 0 %
Lymphocytes Relative: 16 %
Lymphs Abs: 1.4 10*3/uL (ref 0.7–4.0)
MCH: 32.3 pg (ref 26.0–34.0)
MCHC: 33.3 g/dL (ref 30.0–36.0)
MCV: 96.8 fL (ref 80.0–100.0)
Monocytes Absolute: 0.6 10*3/uL (ref 0.1–1.0)
Monocytes Relative: 7 %
Neutro Abs: 6.7 10*3/uL (ref 1.7–7.7)
Neutrophils Relative %: 75 %
Platelets: 92 10*3/uL — ABNORMAL LOW (ref 150–400)
RBC: 4.93 MIL/uL (ref 4.22–5.81)
RDW: 12.7 % (ref 11.5–15.5)
WBC: 9 10*3/uL (ref 4.0–10.5)
nRBC: 0 % (ref 0.0–0.2)

## 2022-12-24 LAB — LACTATE DEHYDROGENASE: LDH: 159 U/L (ref 98–192)

## 2022-12-24 LAB — COMPREHENSIVE METABOLIC PANEL
ALT: 30 U/L (ref 0–44)
AST: 35 U/L (ref 15–41)
Albumin: 4.4 g/dL (ref 3.5–5.0)
Alkaline Phosphatase: 99 U/L (ref 38–126)
Anion gap: 9 (ref 5–15)
BUN: 10 mg/dL (ref 8–23)
CO2: 25 mmol/L (ref 22–32)
Calcium: 9 mg/dL (ref 8.9–10.3)
Chloride: 100 mmol/L (ref 98–111)
Creatinine, Ser: 1.02 mg/dL (ref 0.61–1.24)
GFR, Estimated: >60 mL/min (ref >60)
Glucose, Bld: 114 mg/dL — ABNORMAL HIGH (ref 70–99)
Potassium: 3.9 mmol/L (ref 3.5–5.1)
Sodium: 134 mmol/L — ABNORMAL LOW (ref 135–145)
Total Bilirubin: 1.1 mg/dL (ref 0.3–1.2)
Total Protein: 7.7 g/dL (ref 6.5–8.1)

## 2022-12-24 LAB — TECHNOLOGIST SMEAR REVIEW: Plt Morphology: ADEQUATE

## 2022-12-24 LAB — APTT: aPTT: 28 s (ref 24–36)

## 2022-12-24 LAB — HEPATITIS B CORE ANTIBODY, IGM: Hep B C IgM: NONREACTIVE

## 2022-12-24 LAB — FOLATE: Folate: 10.6 ng/mL (ref >5.9)

## 2022-12-24 LAB — VITAMIN B12: Vitamin B-12: 277 pg/mL (ref 180–914)

## 2022-12-24 LAB — PROTIME-INR
INR: 1.1 (ref 0.8–1.2)
Prothrombin Time: 14.4 s (ref 11.4–15.2)

## 2022-12-24 LAB — IMMATURE PLATELET FRACTION: Immature Platelet Fraction: 12 % — ABNORMAL HIGH (ref 1.2–8.6)

## 2022-12-24 LAB — HIV ANTIBODY (ROUTINE TESTING W REFLEX): HIV Screen 4th Generation wRfx: NONREACTIVE

## 2022-12-24 LAB — HEPATITIS B SURFACE ANTIGEN: Hepatitis B Surface Ag: NONREACTIVE

## 2022-12-24 MED ORDER — DEXAMETHASONE 4 MG PO TABS: 40.0000 mg | ORAL_TABLET | Freq: Every day | ORAL | 0 refills | Status: DC

## 2022-12-24 NOTE — Progress Notes (Signed)
Bruising: no Bleeding from gums: no  Has some trouble sleeping, takes tylenol pm to help.  SOB with exertion.  AAA surgery sch'd for 12/31/22.

## 2022-12-31 DIAGNOSIS — I714 Abdominal aortic aneurysm, without rupture, unspecified: Secondary | ICD-10-CM

## 2023-01-01 ENCOUNTER — Encounter: Payer: Self-pay | Admitting: Internal Medicine

## 2023-01-01 ENCOUNTER — Telehealth (INDEPENDENT_AMBULATORY_CARE_PROVIDER_SITE_OTHER): Payer: Self-pay

## 2023-01-01 ENCOUNTER — Inpatient Hospital Stay: Payer: Medicare Other

## 2023-01-01 ENCOUNTER — Telehealth: Payer: Self-pay | Admitting: *Deleted

## 2023-01-01 ENCOUNTER — Inpatient Hospital Stay (HOSPITAL_BASED_OUTPATIENT_CLINIC_OR_DEPARTMENT_OTHER): Payer: Medicare Other | Admitting: Internal Medicine

## 2023-01-01 VITALS — BP 133/71 | HR 64 | Temp 96.0°F | Ht 72.0 in | Wt 202.0 lb

## 2023-01-01 DIAGNOSIS — F17210 Nicotine dependence, cigarettes, uncomplicated: Secondary | ICD-10-CM | POA: Diagnosis not present

## 2023-01-01 DIAGNOSIS — D696 Thrombocytopenia, unspecified: Secondary | ICD-10-CM

## 2023-01-01 DIAGNOSIS — Z808 Family history of malignant neoplasm of other organs or systems: Secondary | ICD-10-CM | POA: Diagnosis not present

## 2023-01-01 DIAGNOSIS — I252 Old myocardial infarction: Secondary | ICD-10-CM | POA: Diagnosis not present

## 2023-01-01 DIAGNOSIS — N181 Chronic kidney disease, stage 1: Secondary | ICD-10-CM | POA: Diagnosis not present

## 2023-01-01 DIAGNOSIS — I714 Abdominal aortic aneurysm, without rupture, unspecified: Secondary | ICD-10-CM | POA: Diagnosis not present

## 2023-01-01 DIAGNOSIS — E785 Hyperlipidemia, unspecified: Secondary | ICD-10-CM | POA: Diagnosis not present

## 2023-01-01 DIAGNOSIS — Z8673 Personal history of transient ischemic attack (TIA), and cerebral infarction without residual deficits: Secondary | ICD-10-CM | POA: Diagnosis not present

## 2023-01-01 DIAGNOSIS — Z8249 Family history of ischemic heart disease and other diseases of the circulatory system: Secondary | ICD-10-CM | POA: Diagnosis not present

## 2023-01-01 DIAGNOSIS — I2510 Atherosclerotic heart disease of native coronary artery without angina pectoris: Secondary | ICD-10-CM | POA: Diagnosis not present

## 2023-01-01 DIAGNOSIS — Z7952 Long term (current) use of systemic steroids: Secondary | ICD-10-CM | POA: Diagnosis not present

## 2023-01-01 DIAGNOSIS — Z79899 Other long term (current) drug therapy: Secondary | ICD-10-CM | POA: Diagnosis not present

## 2023-01-01 LAB — CBC WITH DIFFERENTIAL (CANCER CENTER ONLY)
Abs Immature Granulocytes: 0.2 10*3/uL — ABNORMAL HIGH (ref 0.00–0.07)
Basophils Absolute: 0 10*3/uL (ref 0.0–0.1)
Basophils Relative: 0 %
Eosinophils Absolute: 0.1 10*3/uL (ref 0.0–0.5)
Eosinophils Relative: 1 %
HCT: 46.2 % (ref 39.0–52.0)
Hemoglobin: 15.6 g/dL (ref 13.0–17.0)
Immature Granulocytes: 2 %
Lymphocytes Relative: 20 %
Lymphs Abs: 2.1 10*3/uL (ref 0.7–4.0)
MCH: 32.8 pg (ref 26.0–34.0)
MCHC: 33.8 g/dL (ref 30.0–36.0)
MCV: 97.1 fL (ref 80.0–100.0)
Monocytes Absolute: 0.6 10*3/uL (ref 0.1–1.0)
Monocytes Relative: 6 %
Neutro Abs: 7.1 10*3/uL (ref 1.7–7.7)
Neutrophils Relative %: 71 %
Platelet Count: 85 10*3/uL — ABNORMAL LOW (ref 150–400)
RBC: 4.76 MIL/uL (ref 4.22–5.81)
RDW: 12.9 % (ref 11.5–15.5)
WBC Count: 10.1 10*3/uL (ref 4.0–10.5)
nRBC: 0 % (ref 0.0–0.2)

## 2023-01-01 LAB — BASIC METABOLIC PANEL
Anion gap: 7 (ref 5–15)
BUN: 15 mg/dL (ref 8–23)
CO2: 27 mmol/L (ref 22–32)
Calcium: 8.4 mg/dL — ABNORMAL LOW (ref 8.9–10.3)
Chloride: 101 mmol/L (ref 98–111)
Creatinine, Ser: 0.98 mg/dL (ref 0.61–1.24)
GFR, Estimated: >60 mL/min (ref >60)
Glucose, Bld: 168 mg/dL — ABNORMAL HIGH (ref 70–99)
Potassium: 3.5 mmol/L (ref 3.5–5.1)
Sodium: 135 mmol/L (ref 135–145)

## 2023-01-01 NOTE — Progress Notes (Signed)
Has had some loose stools x1-2 days, better today.  Some SOB with walking, and cramps in calves. Goes away if he stops for 5 minutes to rest.

## 2023-01-01 NOTE — Telephone Encounter (Signed)
Telephone call to Mr. Michael Bonilla to inform him that Dr Donneta Romberg spoke with Dr. Lorretta Harp today. Dr Lorretta Harp is comfortable performing his surgery with current platelet count. So, patient will not require any IVIG infusions before surgery. I informed him of his next follow up appt with Dr B on Oct 4th at 9:45am. Pt is going to call Dr Eustace Quail office to confirm a date for surgery.

## 2023-01-01 NOTE — Addendum Note (Signed)
Addended by: Clydia Llano on: 01/01/2023 03:00 PM   Modules accepted: Orders

## 2023-01-01 NOTE — Assessment & Plan Note (Addendum)
#   Thrombocytopenia: Mild/moderate [2016-1320-130; JAN-AUG 2024- 80s]; normal Hb/ WBC.   #  S/p dexamethasone 40 mg x 4 days-platelets 88 not significantly improved from baseline of 92 glucose-steroids. ? ITP vs others. Discussed re: IVIG 400 mg/kg x 2. . Discussed the potential side effects of IVIG including- infusion reactions etc. However, HOLD off IVIG at this time-see below.   # Discussed with vascular surgery-as this is a minimally invasive surgery at this time-platelets above 70 is reasonable to proceed with surgery.  Hold off IVIG.  Consider IVIG infusion if platelets get worse less than 50/or bleeding postoperatively.   # DISPOSITION: # follow up TBD- Dr.B  Addendum: pt to be informed. Will have pt follow up in 1 month- MD: labs- cbc/cmp-Dr.B
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
## 2023-01-01 NOTE — Telephone Encounter (Signed)
Spoke with the patient and he is scheduled with Dr. Gilda Crease on 01/07/23 for a endovascular stent graft repair at [facility]. This was rescheduled from 12/24/22.

## 2023-01-02 NOTE — Telephone Encounter (Signed)
I spoke with the patient and let him know that his AAA surgery with Dr. Gilda Crease has been moved to 01/06/23 at the [facility] and his arrival time will be 9:00 am. Patient understood and stated he preferred that arrival time and wrote it down.

## 2023-01-05 ENCOUNTER — Encounter: Payer: Self-pay | Admitting: Vascular Surgery
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
## 2023-01-05 NOTE — Progress Notes (Addendum)
  Perioperative Services Pre-Admission/Anesthesia Testing    Date: 01/02/23  Name: Michael Bonilla MRN:   [id number]  Re: Hematology clearance for surgery  Planned Surgical Procedure(s):    Case: [id number] Date/Time: 01/06/23 1000   Procedure: ENDOVASCULAR REPAIR/STENT GRAFT   Anesthesia type: General   Diagnosis: Abdominal aortic aneurysm (AAA) without rupture, unspecified part (HCC) [I71.40]   Pre-op diagnosis:      AAA   ANESTHESIA   GORE    AAA repair     Dr Gilda Crease w Dr Wyn Quaker to assist   Location: [facility]   Providers: Renford Dills, MD      Clinical Notes:  Patient is scheduled for the above procedure on 01/06/2023 with Dr. Levora Dredge, MD. This procedure was rescheduled from original date of 12/24/2022 after patient found to have progressive thrombocytopenia. Platelet count was 88 K/uL with an unremarkable smear review.   Patient has been seen in consult by hematology Donneta Romberg, MD) at this point. Top DDx seem to be hepatic etiology vs autoimmune ITP. Coagulation studies, viral testing, and vitamin levels all normal. IPF was elevated at 12.0%, making ITP a distinct clinical possibility. With that said, in the absence of bone marrow testing, ITP Dx cannot be formally made and/or excluded.   Patient was treated with a corticosteroid burst (dexamethasone 40 mg x 4 days) with repeat labs approximately 1 week later. Platelet count  was essentially unresponsive to steroid therapy. Repeat platelet count was 85 K/uL on 01/01/2023. Recommendations from hematology are to administer intravenous IVIG, however in setting of patient's known >5.0 cm AAA, this intervention could be postponed until after his endovascular repair has been completed, or if patient's platelet count acutely worsens. Per hematology, "platelets above 70 is reasonable to proceed with surgery. Hold off on IVIG. Consider IVIG infusion if platelets get worse (<50 K/uL), or if there is bleeding  postoperatively".   Impression and Plan:  Michael Bonilla is scheduled for vascular surgery on 01/06/2023. Patient with thrombocytopenia of unknown etiology. Thrombocytopenia is unresponsive to corticosteroid therapy. Following consult with hematology, IVIG infusions have been recommended, however Dr. Donneta Romberg has advised that patient's platelet count is sufficient enough at this point to proceed with surgery; IVIG on hold. Patient has been appropriately cleared to proceed with the planned procedure by hematology.   Quentin Mulling, MSN, APRN, FNP-C, CEN [facility]  Perioperative Services Nurse Practitioner Phone: [phone]  NOTE: This note has been prepared using Dragon dictation software. Despite my best ability to proofread, there is always the potential that unintentional transcriptional errors may still occur from this process.

## 2023-01-06 ENCOUNTER — Other Ambulatory Visit: Payer: Self-pay

## 2023-01-06 ENCOUNTER — Inpatient Hospital Stay: Payer: Self-pay | Admitting: Urgent Care

## 2023-01-06 ENCOUNTER — Encounter
Admission: RE | Disposition: A | Discharge status: Home / Self Care | Payer: Self-pay | Source: Home / Self Care | Attending: Vascular Surgery

## 2023-01-06 ENCOUNTER — Encounter: Payer: Self-pay | Admitting: Vascular Surgery

## 2023-01-06 ENCOUNTER — Inpatient Hospital Stay
Admission: RE | Admit: 2023-01-06 | Discharge: 2023-01-07 | DRG: 269 | Disposition: A | Discharge status: Home / Self Care | Payer: Medicare Other | Attending: Vascular Surgery | Admitting: Vascular Surgery

## 2023-01-06 DIAGNOSIS — K219 Gastro-esophageal reflux disease without esophagitis: Secondary | ICD-10-CM | POA: Diagnosis not present

## 2023-01-06 DIAGNOSIS — F419 Anxiety disorder, unspecified: Secondary | ICD-10-CM | POA: Diagnosis present

## 2023-01-06 DIAGNOSIS — I2510 Atherosclerotic heart disease of native coronary artery without angina pectoris: Secondary | ICD-10-CM | POA: Diagnosis present

## 2023-01-06 DIAGNOSIS — D693 Immune thrombocytopenic purpura: Secondary | ICD-10-CM | POA: Diagnosis present

## 2023-01-06 DIAGNOSIS — I69351 Hemiplegia and hemiparesis following cerebral infarction affecting right dominant side: Secondary | ICD-10-CM

## 2023-01-06 DIAGNOSIS — D696 Thrombocytopenia, unspecified: Secondary | ICD-10-CM | POA: Diagnosis not present

## 2023-01-06 DIAGNOSIS — I130 Hypertensive heart and chronic kidney disease with heart failure and stage 1 through stage 4 chronic kidney disease, or unspecified chronic kidney disease: Secondary | ICD-10-CM | POA: Diagnosis not present

## 2023-01-06 DIAGNOSIS — I429 Cardiomyopathy, unspecified: Secondary | ICD-10-CM | POA: Diagnosis present

## 2023-01-06 DIAGNOSIS — I714 Abdominal aortic aneurysm, without rupture, unspecified: Principal | ICD-10-CM | POA: Diagnosis present

## 2023-01-06 DIAGNOSIS — K5790 Diverticulosis of intestine, part unspecified, without perforation or abscess without bleeding: Secondary | ICD-10-CM | POA: Diagnosis present

## 2023-01-06 DIAGNOSIS — Z955 Presence of coronary angioplasty implant and graft: Secondary | ICD-10-CM | POA: Diagnosis not present

## 2023-01-06 DIAGNOSIS — R7401 Elevation of levels of liver transaminase levels: Secondary | ICD-10-CM | POA: Diagnosis present

## 2023-01-06 DIAGNOSIS — I4901 Ventricular fibrillation: Secondary | ICD-10-CM | POA: Diagnosis present

## 2023-01-06 DIAGNOSIS — I5022 Chronic systolic (congestive) heart failure: Secondary | ICD-10-CM | POA: Diagnosis present

## 2023-01-06 DIAGNOSIS — Z8249 Family history of ischemic heart disease and other diseases of the circulatory system: Secondary | ICD-10-CM | POA: Diagnosis not present

## 2023-01-06 DIAGNOSIS — I252 Old myocardial infarction: Secondary | ICD-10-CM

## 2023-01-06 DIAGNOSIS — I77810 Thoracic aortic ectasia: Secondary | ICD-10-CM | POA: Diagnosis not present

## 2023-01-06 DIAGNOSIS — M5030 Other cervical disc degeneration, unspecified cervical region: Secondary | ICD-10-CM | POA: Diagnosis present

## 2023-01-06 DIAGNOSIS — F1010 Alcohol abuse, uncomplicated: Secondary | ICD-10-CM | POA: Diagnosis present

## 2023-01-06 DIAGNOSIS — I739 Peripheral vascular disease, unspecified: Secondary | ICD-10-CM | POA: Diagnosis present

## 2023-01-06 DIAGNOSIS — G629 Polyneuropathy, unspecified: Secondary | ICD-10-CM | POA: Diagnosis present

## 2023-01-06 DIAGNOSIS — F17210 Nicotine dependence, cigarettes, uncomplicated: Secondary | ICD-10-CM | POA: Diagnosis present

## 2023-01-06 DIAGNOSIS — R972 Elevated prostate specific antigen [PSA]: Secondary | ICD-10-CM | POA: Diagnosis present

## 2023-01-06 DIAGNOSIS — N181 Chronic kidney disease, stage 1: Secondary | ICD-10-CM | POA: Diagnosis present

## 2023-01-06 DIAGNOSIS — Z85038 Personal history of other malignant neoplasm of large intestine: Secondary | ICD-10-CM | POA: Diagnosis not present

## 2023-01-06 DIAGNOSIS — E782 Mixed hyperlipidemia: Secondary | ICD-10-CM | POA: Diagnosis present

## 2023-01-06 HISTORY — DX: Diverticulosis of intestine, part unspecified, without perforation or abscess without bleeding: K57.90

## 2023-01-06 HISTORY — DX: Unspecified systolic (congestive) heart failure: I50.20

## 2023-01-06 HISTORY — DX: Other cervical disc degeneration, unspecified cervical region: M50.30

## 2023-01-06 HISTORY — PX: ENDOVASCULAR STENT GRAFT (AAA): CATH118280

## 2023-01-06 LAB — BASIC METABOLIC PANEL
Anion gap: 10 (ref 5–15)
BUN: 10 mg/dL (ref 8–23)
CO2: 27 mmol/L (ref 22–32)
Calcium: 8.4 mg/dL — ABNORMAL LOW (ref 8.9–10.3)
Chloride: 101 mmol/L (ref 98–111)
Creatinine, Ser: 0.98 mg/dL (ref 0.61–1.24)
GFR, Estimated: >60 mL/min (ref >60)
Glucose, Bld: 127 mg/dL — ABNORMAL HIGH (ref 70–99)
Potassium: 4.3 mmol/L (ref 3.5–5.1)
Sodium: 138 mmol/L (ref 135–145)

## 2023-01-06 LAB — CBC
HCT: 45.1 % (ref 39.0–52.0)
Hemoglobin: 14.6 g/dL (ref 13.0–17.0)
MCH: 32.4 pg (ref 26.0–34.0)
MCHC: 32.4 g/dL (ref 30.0–36.0)
MCV: 100 fL (ref 80.0–100.0)
Platelets: 56 10*3/uL — ABNORMAL LOW (ref 150–400)
RBC: 4.51 MIL/uL (ref 4.22–5.81)
RDW: 13.2 % (ref 11.5–15.5)
WBC: 7.9 10*3/uL (ref 4.0–10.5)
nRBC: 0 % (ref 0.0–0.2)

## 2023-01-06 LAB — GLUCOSE, CAPILLARY: Glucose-Capillary: 149 mg/dL — ABNORMAL HIGH (ref 70–99)

## 2023-01-06 LAB — APTT: aPTT: 141 s — ABNORMAL HIGH (ref 24–36)

## 2023-01-06 LAB — MAGNESIUM: Magnesium: 1.4 mg/dL — ABNORMAL LOW (ref 1.7–2.4)

## 2023-01-06 LAB — PROTIME-INR
INR: 1.2 (ref 0.8–1.2)
Prothrombin Time: 15 s (ref 11.4–15.2)

## 2023-01-06 LAB — MRSA NEXT GEN BY PCR, NASAL: MRSA by PCR Next Gen: NOT DETECTED

## 2023-01-06 SURGERY — ENDOVASCULAR STENT GRAFT (AAA)
Anesthesia: General

## 2023-01-06 MED ORDER — CEFAZOLIN SODIUM-DEXTROSE 2-4 GM/100ML-% IV SOLN
2.0000 g | INTRAVENOUS | Status: AC
  Administered 2023-01-06: 2 g via INTRAVENOUS

## 2023-01-06 MED ORDER — PROPOFOL 10 MG/ML IV BOLUS
INTRAVENOUS | Status: AC
  Filled 2023-01-06: qty 20

## 2023-01-06 MED ORDER — POTASSIUM CHLORIDE CRYS ER 20 MEQ PO TBCR: 20.0000 meq | EXTENDED_RELEASE_TABLET | Freq: Every day | ORAL | Status: DC | PRN

## 2023-01-06 MED ORDER — ACETAMINOPHEN 10 MG/ML IV SOLN
INTRAVENOUS | Status: AC
  Filled 2023-01-06: qty 100

## 2023-01-06 MED ORDER — ONDANSETRON HCL 4 MG/2ML IJ SOLN
INTRAMUSCULAR | Status: DC | PRN
  Administered 2023-01-06: 4 mg via INTRAVENOUS

## 2023-01-06 MED ORDER — METHOCARBAMOL 500 MG PO TABS: 500.0000 mg | ORAL_TABLET | Freq: Three times a day (TID) | ORAL | Status: DC | PRN

## 2023-01-06 MED ORDER — BUSPIRONE HCL 10 MG PO TABS
10.0000 mg | ORAL_TABLET | Freq: Three times a day (TID) | ORAL | Status: DC
  Administered 2023-01-06 – 2023-01-07 (×2): 10 mg via ORAL
  Filled 2023-01-06 (×2): qty 1

## 2023-01-06 MED ORDER — CEFAZOLIN SODIUM-DEXTROSE 2-4 GM/100ML-% IV SOLN
INTRAVENOUS | Status: AC
  Filled 2023-01-06: qty 100

## 2023-01-06 MED ORDER — OXYCODONE HCL 5 MG PO TABS
ORAL_TABLET | ORAL | Status: AC
  Filled 2023-01-06: qty 1

## 2023-01-06 MED ORDER — PHENYLEPHRINE HCL-NACL 20-0.9 MG/250ML-% IV SOLN
INTRAVENOUS | Status: DC | PRN
  Administered 2023-01-06: 30 ug/min via INTRAVENOUS

## 2023-01-06 MED ORDER — PHENYLEPHRINE HCL-NACL 20-0.9 MG/250ML-% IV SOLN
INTRAVENOUS | Status: AC
  Filled 2023-01-06: qty 250

## 2023-01-06 MED ORDER — CHLORHEXIDINE GLUCONATE CLOTH 2 % EX PADS: 6.0000 | MEDICATED_PAD | Freq: Once | CUTANEOUS | Status: AC

## 2023-01-06 MED ORDER — HEPARIN SODIUM (PORCINE) 1000 UNIT/ML IJ SOLN
INTRAMUSCULAR | Status: AC
  Filled 2023-01-06: qty 30

## 2023-01-06 MED ORDER — DOCUSATE SODIUM 100 MG PO CAPS
100.0000 mg | ORAL_CAPSULE | Freq: Every day | ORAL | Status: DC
  Administered 2023-01-07: 100 mg via ORAL
  Filled 2023-01-06: qty 1

## 2023-01-06 MED ORDER — SODIUM CHLORIDE 0.9 % IV SOLN
INTRAVENOUS | Status: DC
  Administered 2023-01-06: 75 mL/h via INTRAVENOUS

## 2023-01-06 MED ORDER — OXYCODONE HCL 5 MG PO TABS
5.0000 mg | ORAL_TABLET | Freq: Once | ORAL | Status: AC | PRN
  Administered 2023-01-06: 5 mg via ORAL

## 2023-01-06 MED ORDER — FENTANYL CITRATE (PF) 100 MCG/2ML IJ SOLN
INTRAMUSCULAR | Status: AC
  Filled 2023-01-06: qty 2

## 2023-01-06 MED ORDER — CHLORHEXIDINE GLUCONATE 0.12 % MT SOLN
15.0000 mL | Freq: Once | OROMUCOSAL | Status: AC
  Administered 2023-01-06: 15 mL via OROMUCOSAL
  Filled 2023-01-06: qty 15

## 2023-01-06 MED ORDER — HYDROMORPHONE HCL 1 MG/ML IJ SOLN: 1.0000 mg | Freq: Once | INTRAMUSCULAR | Status: DC | PRN

## 2023-01-06 MED ORDER — HEPARIN (PORCINE) IN NACL 2000-0.9 UNIT/L-% IV SOLN
INTRAVENOUS | Status: DC | PRN
  Administered 2023-01-06: 1000 mL

## 2023-01-06 MED ORDER — DEXAMETHASONE SODIUM PHOSPHATE 10 MG/ML IJ SOLN
INTRAMUSCULAR | Status: AC
  Filled 2023-01-06: qty 1

## 2023-01-06 MED ORDER — SUGAMMADEX SODIUM 200 MG/2ML IV SOLN
INTRAVENOUS | Status: DC | PRN
  Administered 2023-01-06: 100 mg via INTRAVENOUS
  Administered 2023-01-06: 200 mg via INTRAVENOUS

## 2023-01-06 MED ORDER — HEPARIN SODIUM (PORCINE) 1000 UNIT/ML IJ SOLN
INTRAMUSCULAR | Status: DC | PRN
Start: 2023-01-06 — End: 2023-01-06
  Administered 2023-01-06: 6000 [IU] via INTRAVENOUS

## 2023-01-06 MED ORDER — PHENOL 1.4 % MT LIQD: 1.0000 | OROMUCOSAL | Status: DC | PRN

## 2023-01-06 MED ORDER — ORAL CARE MOUTH RINSE: 15.0000 mL | Freq: Once | OROMUCOSAL | Status: AC

## 2023-01-06 MED ORDER — LIDOCAINE HCL (PF) 2 % IJ SOLN
INTRAMUSCULAR | Status: AC
  Filled 2023-01-06: qty 5

## 2023-01-06 MED ORDER — LACTATED RINGERS IV SOLN: INTRAVENOUS | Status: DC

## 2023-01-06 MED ORDER — SACUBITRIL-VALSARTAN 24-26 MG PO TABS
1.0000 | ORAL_TABLET | Freq: Two times a day (BID) | ORAL | Status: DC
  Administered 2023-01-06 – 2023-01-07 (×2): 1 via ORAL
  Filled 2023-01-06 (×2): qty 1

## 2023-01-06 MED ORDER — PROPOFOL 10 MG/ML IV BOLUS
INTRAVENOUS | Status: AC
  Filled 2023-01-06: qty 40

## 2023-01-06 MED ORDER — ALUM & MAG HYDROXIDE-SIMETH 200-200-20 MG/5ML PO SUSP: 15.0000 mL | ORAL | Status: DC | PRN

## 2023-01-06 MED ORDER — EPINEPHRINE 1 MG/10ML IJ SOSY
PREFILLED_SYRINGE | INTRAMUSCULAR | Status: AC
  Filled 2023-01-06: qty 10

## 2023-01-06 MED ORDER — EPHEDRINE 5 MG/ML INJ
INTRAVENOUS | Status: AC
  Filled 2023-01-06: qty 5

## 2023-01-06 MED ORDER — IODIXANOL 320 MG/ML IV SOLN
INTRAVENOUS | Status: DC | PRN
  Administered 2023-01-06: 90 mL via INTRA_ARTERIAL

## 2023-01-06 MED ORDER — EPHEDRINE SULFATE (PRESSORS) 50 MG/ML IJ SOLN
INTRAMUSCULAR | Status: DC | PRN
  Administered 2023-01-06 (×2): 5 mg via INTRAVENOUS

## 2023-01-06 MED ORDER — ONDANSETRON HCL 4 MG/2ML IJ SOLN
INTRAMUSCULAR | Status: AC
  Filled 2023-01-06: qty 2

## 2023-01-06 MED ORDER — ALBUMIN HUMAN 5 % IV SOLN
INTRAVENOUS | Status: AC
  Filled 2023-01-06: qty 250

## 2023-01-06 MED ORDER — MORPHINE SULFATE (PF) 2 MG/ML IV SOLN: 2.0000 mg | INTRAVENOUS | Status: DC | PRN

## 2023-01-06 MED ORDER — SIMVASTATIN 20 MG PO TABS
40.0000 mg | ORAL_TABLET | Freq: Every day | ORAL | Status: DC
  Administered 2023-01-06 – 2023-01-07 (×2): 40 mg via ORAL
  Filled 2023-01-06 (×2): qty 2

## 2023-01-06 MED ORDER — ROCURONIUM BROMIDE 100 MG/10ML IV SOLN
INTRAVENOUS | Status: DC | PRN
  Administered 2023-01-06: 40 mg via INTRAVENOUS
  Administered 2023-01-06: 50 mg via INTRAVENOUS

## 2023-01-06 MED ORDER — ASPIRIN 81 MG PO TBEC
81.0000 mg | DELAYED_RELEASE_TABLET | Freq: Every day | ORAL | Status: DC
  Administered 2023-01-06 – 2023-01-07 (×2): 81 mg via ORAL
  Filled 2023-01-06 (×2): qty 1

## 2023-01-06 MED ORDER — ACETAMINOPHEN 325 MG PO TABS: 325.0000 mg | ORAL_TABLET | ORAL | Status: DC | PRN

## 2023-01-06 MED ORDER — FAMOTIDINE IN NACL 20-0.9 MG/50ML-% IV SOLN
20.0000 mg | Freq: Two times a day (BID) | INTRAVENOUS | Status: DC
  Administered 2023-01-06: 20 mg via INTRAVENOUS
  Filled 2023-01-06: qty 50

## 2023-01-06 MED ORDER — ALBUTEROL SULFATE HFA 108 (90 BASE) MCG/ACT IN AERS
INHALATION_SPRAY | RESPIRATORY_TRACT | Status: DC | PRN
Start: 2023-01-06 — End: 2023-01-06
  Administered 2023-01-06 (×2): 4 via RESPIRATORY_TRACT

## 2023-01-06 MED ORDER — CARVEDILOL 12.5 MG PO TABS
12.5000 mg | ORAL_TABLET | Freq: Two times a day (BID) | ORAL | Status: DC
  Administered 2023-01-06 – 2023-01-07 (×2): 12.5 mg via ORAL
  Filled 2023-01-06 (×2): qty 1

## 2023-01-06 MED ORDER — CHLORHEXIDINE GLUCONATE CLOTH 2 % EX PADS
6.0000 | MEDICATED_PAD | Freq: Once | CUTANEOUS | Status: AC
  Administered 2023-01-06: 6 via TOPICAL

## 2023-01-06 MED ORDER — NITROGLYCERIN IN D5W 200-5 MCG/ML-% IV SOLN
INTRAVENOUS | Status: AC
  Filled 2023-01-06: qty 250

## 2023-01-06 MED ORDER — CEFAZOLIN SODIUM-DEXTROSE 2-4 GM/100ML-% IV SOLN
2.0000 g | Freq: Three times a day (TID) | INTRAVENOUS | Status: AC
  Administered 2023-01-06 – 2023-01-07 (×2): 2 g via INTRAVENOUS
  Filled 2023-01-06 (×2): qty 100

## 2023-01-06 MED ORDER — MIDAZOLAM HCL 2 MG/2ML IJ SOLN
INTRAMUSCULAR | Status: AC
  Filled 2023-01-06: qty 2

## 2023-01-06 MED ORDER — LABETALOL HCL 5 MG/ML IV SOLN: 10.0000 mg | INTRAVENOUS | Status: DC | PRN

## 2023-01-06 MED ORDER — PROPOFOL 10 MG/ML IV BOLUS
INTRAVENOUS | Status: DC | PRN
  Administered 2023-01-06: 50 mg via INTRAVENOUS
  Administered 2023-01-06: 100 mg via INTRAVENOUS

## 2023-01-06 MED ORDER — LIDOCAINE HCL (CARDIAC) PF 100 MG/5ML IV SOSY
PREFILLED_SYRINGE | INTRAVENOUS | Status: DC | PRN
  Administered 2023-01-06: 100 mg via INTRAVENOUS

## 2023-01-06 MED ORDER — MIDAZOLAM HCL 2 MG/2ML IJ SOLN
INTRAMUSCULAR | Status: DC | PRN
  Administered 2023-01-06: 2 mg via INTRAVENOUS

## 2023-01-06 MED ORDER — NITROGLYCERIN IN D5W 200-5 MCG/ML-% IV SOLN: 5.0000 ug/min | INTRAVENOUS | Status: DC

## 2023-01-06 MED ORDER — CHLORHEXIDINE GLUCONATE CLOTH 2 % EX PADS
6.0000 | MEDICATED_PAD | Freq: Every day | CUTANEOUS | Status: DC
  Administered 2023-01-07: 6 via TOPICAL

## 2023-01-06 MED ORDER — ONDANSETRON HCL 4 MG/2ML IJ SOLN: 4.0000 mg | Freq: Four times a day (QID) | INTRAMUSCULAR | Status: DC | PRN

## 2023-01-06 MED ORDER — HYDRALAZINE HCL 20 MG/ML IJ SOLN: 5.0000 mg | INTRAMUSCULAR | Status: DC | PRN

## 2023-01-06 MED ORDER — DOPAMINE-DEXTROSE 3.2-5 MG/ML-% IV SOLN: 3.0000 ug/kg/min | INTRAVENOUS | Status: DC

## 2023-01-06 MED ORDER — SODIUM CHLORIDE 0.9 % IV SOLN: 500.0000 mL | Freq: Once | INTRAVENOUS | Status: DC | PRN

## 2023-01-06 MED ORDER — GABAPENTIN 300 MG PO CAPS
300.0000 mg | ORAL_CAPSULE | Freq: Three times a day (TID) | ORAL | Status: DC
  Administered 2023-01-06 – 2023-01-07 (×2): 300 mg via ORAL
  Filled 2023-01-06 (×2): qty 1

## 2023-01-06 MED ORDER — GUAIFENESIN-DM 100-10 MG/5ML PO SYRP: 15.0000 mL | ORAL_SOLUTION | ORAL | Status: DC | PRN

## 2023-01-06 MED ORDER — ACETAMINOPHEN 10 MG/ML IV SOLN
1000.0000 mg | Freq: Once | INTRAVENOUS | Status: DC | PRN
  Administered 2023-01-06: 1000 mg via INTRAVENOUS

## 2023-01-06 MED ORDER — MAGNESIUM SULFATE 2 GM/50ML IV SOLN: 2.0000 g | Freq: Every day | INTRAVENOUS | Status: DC | PRN

## 2023-01-06 MED ORDER — OXYCODONE HCL 5 MG/5ML PO SOLN: 5.0000 mg | Freq: Once | ORAL | Status: AC | PRN

## 2023-01-06 MED ORDER — METOPROLOL TARTRATE 5 MG/5ML IV SOLN: 2.0000 mg | INTRAVENOUS | Status: DC | PRN

## 2023-01-06 MED ORDER — ONDANSETRON HCL 4 MG/2ML IJ SOLN: 4.0000 mg | Freq: Once | INTRAMUSCULAR | Status: DC | PRN

## 2023-01-06 MED ORDER — DEXAMETHASONE SODIUM PHOSPHATE 10 MG/ML IJ SOLN
INTRAMUSCULAR | Status: DC | PRN
  Administered 2023-01-06: 10 mg via INTRAVENOUS

## 2023-01-06 MED ORDER — OXYCODONE-ACETAMINOPHEN 5-325 MG PO TABS
1.0000 | ORAL_TABLET | ORAL | Status: DC | PRN
  Administered 2023-01-06 – 2023-01-07 (×3): 1 via ORAL
  Filled 2023-01-06 (×3): qty 1

## 2023-01-06 MED ORDER — CLOPIDOGREL BISULFATE 75 MG PO TABS
75.0000 mg | ORAL_TABLET | Freq: Every day | ORAL | Status: DC
  Administered 2023-01-07: 75 mg via ORAL
  Filled 2023-01-06: qty 1

## 2023-01-06 MED ORDER — HEPARIN SODIUM (PORCINE) 1000 UNIT/ML IJ SOLN
INTRAMUSCULAR | Status: AC
  Filled 2023-01-06: qty 10

## 2023-01-06 MED ORDER — PHENYLEPHRINE HCL (PRESSORS) 10 MG/ML IV SOLN
INTRAVENOUS | Status: DC | PRN
  Administered 2023-01-06: 80 ug via INTRAVENOUS

## 2023-01-06 MED ORDER — ACETAMINOPHEN 650 MG RE SUPP: 325.0000 mg | RECTAL | Status: DC | PRN

## 2023-01-06 MED ORDER — FENTANYL CITRATE (PF) 100 MCG/2ML IJ SOLN
25.0000 ug | INTRAMUSCULAR | Status: DC | PRN
  Administered 2023-01-06 (×3): 25 ug via INTRAVENOUS

## 2023-01-06 MED ORDER — ALBUTEROL SULFATE HFA 108 (90 BASE) MCG/ACT IN AERS
INHALATION_SPRAY | RESPIRATORY_TRACT | Status: AC
  Filled 2023-01-06: qty 6.7

## 2023-01-06 MED ORDER — FENTANYL CITRATE (PF) 100 MCG/2ML IJ SOLN
INTRAMUSCULAR | Status: DC | PRN
  Administered 2023-01-06 (×2): 50 ug via INTRAVENOUS

## 2023-01-06 SURGICAL SUPPLY — 41 items
ADH SKN CLS APL DERMABOND .7 (GAUZE/BANDAGES/DRESSINGS) ×1
BRUSH SCRUB EZ 4% CHG (MISCELLANEOUS) IMPLANT
CATH ACCU-VU SIZ PIG 5F 70CM (CATHETERS) IMPLANT
CATH BALLN CODA 9X100X32 (BALLOONS) IMPLANT
CATH BEACON 5 .035 65 C2 TIP (CATHETERS) IMPLANT
CATH BEACON 5 .035 65 KMP TIP (CATHETERS) IMPLANT
CLOSURE PERCLOSE PROSTYLE (VASCULAR PRODUCTS) IMPLANT
COVER DRAPE FLUORO 36X44 (DRAPES) IMPLANT
COVER PROBE ULTRASOUND 5X96 (MISCELLANEOUS) IMPLANT
DERMABOND ADVANCED .7 DNX12 (GAUZE/BANDAGES/DRESSINGS) IMPLANT
DEVICE SAFEGUARD 24CM (GAUZE/BANDAGES/DRESSINGS) IMPLANT
DEVICE TORQUE (MISCELLANEOUS) IMPLANT
DRESSING SURGICEL FIBRLLR 1X2 (HEMOSTASIS) IMPLANT
DRSG SURGICEL FIBRILLAR 1X2 (HEMOSTASIS) ×1
ELECT REM PT RETURN 9FT ADLT (ELECTROSURGICAL) ×1
ELECTRODE REM PT RTRN 9FT ADLT (ELECTROSURGICAL) IMPLANT
EXCLUDER TNK LEG 26MX14X18 (Endovascular Graft) IMPLANT
EXCLUDER TRUNK LEG 26MX14X18 (Endovascular Graft) ×1 IMPLANT
GLIDEWIRE STIFF .35X180X3 HYDR (WIRE) IMPLANT
GOWN STRL REUS W/ TWL LRG LVL3 (GOWN DISPOSABLE) IMPLANT
GOWN STRL REUS W/ TWL XL LVL3 (GOWN DISPOSABLE) IMPLANT
GOWN STRL REUS W/TWL LRG LVL3 (GOWN DISPOSABLE) ×2
GOWN STRL REUS W/TWL XL LVL3 (GOWN DISPOSABLE) ×1
GUIDEWIRE PFTE-COATED .018X300 (WIRE) IMPLANT
LEG CONTRALATERAL 16X14.5X12 (Vascular Products) IMPLANT
LEG CONTRALATERAL 16X14.5X14 (Vascular Products) IMPLANT
NDL ENTRY 21GA 7CM ECHOTIP (NEEDLE) IMPLANT
NEEDLE ENTRY 21GA 7CM ECHOTIP (NEEDLE) ×1 IMPLANT
PACK ANGIOGRAPHY (CUSTOM PROCEDURE TRAY) ×1 IMPLANT
PACK BASIN MAJOR ARMC (MISCELLANEOUS) IMPLANT
SET INTRO CAPELLA COAXIAL (SET/KITS/TRAYS/PACK) IMPLANT
SHEATH BRITE TIP 6FRX11 (SHEATH) IMPLANT
SHEATH BRITE TIP 8FRX11 (SHEATH) IMPLANT
SHEATH DRYSEAL FLEX 12FR 33CM (SHEATH) IMPLANT
SHEATH DRYSEAL FLEX 16FR 33CM (SHEATH) IMPLANT
SPONGE XRAY 4X4 16PLY STRL (MISCELLANEOUS) IMPLANT
SYR MEDRAD MARK 7 150ML (SYRINGE) IMPLANT
TRAY FOLEY SLVR 14FR LF STAT (SET/KITS/TRAYS/PACK) IMPLANT
TUBING CONTRAST HIGH PRESS 72 (TUBING) IMPLANT
WIRE AMPLATZ SSTIFF .035X260CM (WIRE) IMPLANT
WIRE GUIDERIGHT .035X150 (WIRE) IMPLANT

## 2023-01-06 NOTE — Op Note (Signed)
OPERATIVE NOTE   PROCEDURE: US guidance for vascular access, bilateral femoral arteries Catheter placement into aorta from bilateral femoral approaches Placement of a 26 x 14 x 8 Gore Excluder Endoprosthesis main body with a 14 x 14 contralateral limb that is extended to the iliac bifurcation within the common iliac using an additional 14 x 12 iliac limb ProGlide closure devices bilateral femoral arteries  PRE-OPERATIVE DIAGNOSIS: AAA  POST-OPERATIVE DIAGNOSIS: same  SURGEON: Levora Dredge, MD and Festus Barren, MD - Co-surgeons  ANESTHESIA: general  ESTIMATED BLOOD LOSS: 50 cc  FINDING(S): 1.  AAA  SPECIMEN(S):  none  INDICATIONS:   Michael Bonilla is a 67 y.o. y.o. male who presents with presents with an abdominal aortic aneurysm that is greater than 5 cm placing him at risk for lethal rupture.  The anatomy is suitable for endovascular repair.  Risks and benefits for repair of the abdominal aortic aneurysm using an endograft was described in detail to the patient all questions have been answered patient agrees to proceed.  Co-surgeons are utilized to expedite the procedure and reduce operative time improving patient's safety and improving outcome.  DESCRIPTION: After obtaining full informed written consent, the patient was brought back to the operating room and placed supine upon the operating table.  The patient received IV antibiotics prior to induction.  After obtaining adequate anesthesia, the patient was prepped and draped in the standard fashion for endovascular AAA repair.  Co-surgeons are required because this is a complex bilateral procedure with work being performed simultaneously from both the right femoral and left femoral approach.  This also expedites the procedure making a shorter operative time reducing complications and improving patient safety.  We then began by gaining access to both femoral arteries with US guidance with me working on the patient's right and Dr.  Wyn Quaker working on the patient's left.  The femoral arteries were found to be patent and accessed without difficulty with a needle under ultrasound guidance without difficulty on each side and permanent images were recorded.  We then placed 2 proglide devices on each side in a pre-close fashion and placed 8 French sheaths.  The patient was then given 6000 units of intravenous heparin.   The Pigtail catheter was placed into the aorta from the right side. Using this image, we selected a 26 x 14 x 18 Main body device.  Over a stiff wire, an 16 French sheath was placed. The main body was then placed through the 16 French sheath. A Kumpe catheter was placed up the left side and a magnified image at the renal arteries was performed. The main body was then deployed just below the lowest renal artery. The Kumpe catheter was used to cannulate the contralateral gate without difficulty and successful cannulation was confirmed by twirling the pigtail catheter in the main body. We then placed a stiff wire and a retrograde arteriogram was performed through the left femoral sheath. We upsized to the 12 [place] sheath for the contralateral limb and a 14 x 14 limb was selected and deployed. The main body deployment was then completed. Based off the angiographic findings we needed to bring the left side down closer to the iliac bifurcation.  A 14 x 12 iliac limb was selected and deployed bringing the left side down to the iliac bifurcation.  All junction points and seals zones were treated with the compliant balloon.   The pigtail catheter was then replaced and a completion angiogram was performed.   No endoleak was  detected on completion angiography. The renal arteries were found to be widely patent.    At this point we elected to terminate the procedure. We secured the pro glide devices for hemostasis on the femoral arteries. The skin incision was closed with a 4-0 Monocryl. Dermabond and pressure dressing were placed. The  patient was taken to the recovery room in stable condition having tolerated the procedure well.  COMPLICATIONS: none  CONDITION: stable  Levora Dredge  01/06/2023, 1:55 PM

## 2023-01-06 NOTE — Op Note (Signed)
OPERATIVE NOTE   PROCEDURE: US guidance for vascular access, bilateral femoral arteries Catheter placement into aorta from bilateral femoral approaches Placement of a 26 mm diameter proximal 14 cm length Gore Excluder Endoprosthesis main body right with a 14 mm diameter by 14 cm length left iliac contralateral limb Placement of 14 mm diameter by 12 cm length left iliac extension limb ProGlide closure devices bilateral femoral arteries  PRE-OPERATIVE DIAGNOSIS: AAA  POST-OPERATIVE DIAGNOSIS: same  SURGEON: Festus Barren, MD and Levora Dredge, MD - Co-surgeons  ANESTHESIA: General  ESTIMATED BLOOD LOSS: 50 cc  FINDING(S): 1.  AAA  SPECIMEN(S):  none  INDICATIONS:   Michael Bonilla is a 67 y.o. male who presents with a greater than 5 cm abdominal aortic aneurysm. The anatomy was suitable for endovascular repair.  Risks and benefits of repair in an endovascular fashion were discussed and informed consent was obtained. Co-surgeons are used to expedite the procedure and reduce operative time as bilateral work needs to be done.  DESCRIPTION: After obtaining full informed written consent, the patient was brought back to the operating room and placed supine upon the operating table.  The patient received IV antibiotics prior to induction.  After obtaining adequate anesthesia, the patient was prepped and draped in the standard fashion for endovascular AAA repair.  We then began by gaining access to both femoral arteries with US guidance with me working on the left and Dr. Gilda Crease working on the right.  The femoral arteries were found to be patent and accessed without difficulty with a needle under ultrasound guidance without difficulty on each side and permanent images were recorded.  We then placed 2 proglide devices on each side in a pre-close fashion and placed 8 French sheaths. The patient was then given 6000 units of intravenous heparin. The Pigtail catheter was placed into the aorta  from the right side. Using this image, we selected a 26 mm diameter by 18 cm length Main body device.  Over a stiff wire, an 16 French sheath was placed [place] the right side. The main body was then placed through the 16 French sheath. A Kumpe catheter was placed up the left side and a magnified image at the renal arteries was performed. The main body was then deployed just below the lowest renal artery was the left renal artery. The Kumpe catheter and then the C2 catheter and 0.018 advantage wire was used to cannulate the contralateral gate with some difficulty and successful cannulation was confirmed by twirling the pigtail catheter in the main body. We then placed a stiff wire and a retrograde arteriogram was performed through the left femoral sheath. We upsized to the 12 [place] sheath on the left for the contralateral limb and a 14 mm diameter by 14 cm length left iliac limb was selected and deployed. The main body deployment was then completed. Based off the angiographic findings, extension limbs were necessary.  An additional 14 mm diameter by 12 cm length left iliac extension limb was placed down to just above the left hypogastric artery. All junction points and seals zones were treated with the compliant balloon. The pigtail catheter was then replaced and a completion angiogram was performed.  No obvious endoleak was detected on completion angiography. The renal arteries were found to be widely patent.  The left hypogastric artery was widely patent.  The right hypogastric artery appeared heavily diseased. At this point we elected to terminate the procedure. We secured the pro glide devices for hemostasis on  the femoral arteries. The skin incision was closed with a 4-0 Monocryl. Dermabond and pressure dressing were placed. The patient was taken to the recovery room in stable condition having tolerated the procedure well.  COMPLICATIONS: none  CONDITION: stable  Festus Barren  01/06/2023, 1:52 PM   This note  was created with Dragon Medical transcription system. Any errors in dictation are purely unintentional.
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
## 2023-01-06 NOTE — Transfer of Care (Signed)
Immediate Anesthesia Transfer of Care Note  Patient: Michael Bonilla [facility]  Procedure(s) Performed: ENDOVASCULAR REPAIR/STENT GRAFT  Patient Location: PACU  Anesthesia Type:General  Level of Consciousness: awake, alert , and oriented  Airway & Oxygen Therapy: Patient Spontanous Breathing  Post-op Assessment: Report given to RN and Post -op Vital signs reviewed and stable  Post vital signs: Reviewed and stable  Last Vitals:  Vitals Value Taken Time  BP 125/68 01/06/23 1715  Temp 36.1 C 01/06/23 1645  Pulse 63 01/06/23 1717  Resp 24 01/06/23 1717  SpO2 99 % 01/06/23 1717  Vitals shown include unfiled device data.  Last Pain:  Vitals:   01/06/23 1645  TempSrc:   PainSc: 5       Patients Stated Pain Goal: 4 (01/06/23 1430)  Complications: No notable events documented.

## 2023-01-06 NOTE — Interval H&P Note (Signed)
History and Physical Interval Note:  01/06/2023 9:02 AM  Michael Bonilla  has presented today for surgery, with the diagnosis of AAA   ANESTHESIA   GORE    AAA repair Dr Gilda Crease w Dr Wyn Quaker to assist.  The various methods of treatment have been discussed with the patient and family. After consideration of risks, benefits and other options for treatment, the patient has consented to  Procedure(s): ENDOVASCULAR REPAIR/STENT GRAFT (N/A) as a surgical intervention.  The patient's history has been reviewed, patient examined, no change in status, stable for surgery.  I have reviewed the patient's chart and labs.  Questions were answered to the patient's satisfaction.     Levora Dredge
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
## 2023-01-06 NOTE — Anesthesia Procedure Notes (Signed)
Procedure Name: Intubation Date/Time: 01/06/2023 12:06 PM  Performed by: Hezzie Bump, CRNAPre-anesthesia Checklist: Patient identified, Patient being monitored, Timeout performed, Emergency Drugs available and Suction available Patient Re-evaluated:Patient Re-evaluated prior to induction Oxygen Delivery Method: Circle system utilized Preoxygenation: Pre-oxygenation with 100% oxygen Induction Type: IV induction Ventilation: Mask ventilation without difficulty Laryngoscope Size: McGraph and 4 Grade View: Grade I Tube type: Oral Tube size: 7.5 mm Number of attempts: 1 Airway Equipment and Method: Stylet Placement Confirmation: ETT inserted through vocal cords under direct vision, positive ETCO2 and breath sounds checked- equal and bilateral Secured at: 22 cm Tube secured with: Tape Dental Injury: Teeth and Oropharynx as per pre-operative assessment

## 2023-01-06 NOTE — Progress Notes (Signed)
1705 Patient received on ICU bed to room ICU 16. Right  pedal and posterior tibial pulses palpated and foot is warm. Left pedal and posterior tibial pulses doppler and foot was cool to the touch. Left great toe pale. PADs noted at bilateral groins containing 40 cc of air in each PAD. Patient alert and oriented. Pleasant.
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
## 2023-01-07 ENCOUNTER — Encounter: Payer: Self-pay | Admitting: Vascular Surgery

## 2023-01-07 DIAGNOSIS — I714 Abdominal aortic aneurysm, without rupture, unspecified: Secondary | ICD-10-CM

## 2023-01-07 MED ORDER — OXYCODONE-ACETAMINOPHEN 5-325 MG PO TABS: 1.0000 | ORAL_TABLET | Freq: Four times a day (QID) | ORAL | 0 refills | Status: DC | PRN

## 2023-01-07 MED ORDER — FAMOTIDINE 20 MG PO TABS
20.0000 mg | ORAL_TABLET | Freq: Two times a day (BID) | ORAL | Status: DC
  Administered 2023-01-07: 20 mg via ORAL
  Filled 2023-01-07: qty 1

## 2023-01-07 NOTE — Anesthesia Postprocedure Evaluation (Signed)
Anesthesia Post Note  Patient: Michael Bonilla [facility]  Procedure(s) Performed: ENDOVASCULAR REPAIR/STENT GRAFT  Patient location during evaluation: SICU Anesthesia Type: General Level of consciousness: awake, awake and alert and oriented Pain management: pain level controlled Vital Signs Assessment: post-procedure vital signs reviewed and stable Respiratory status: spontaneous breathing and patient connected to nasal cannula oxygen Cardiovascular status: stable Postop Assessment: no apparent nausea or vomiting Anesthetic complications: no   No notable events documented.   Last Vitals:  Vitals:   01/07/23 0700 01/07/23 0736  BP: 92/71   Pulse: (!) 48 (!) 46  Resp: 15 12  Temp:  36.5 C  SpO2: 100% 99%    Last Pain:  Vitals:   01/07/23 0738  TempSrc:   PainSc: 5                  Odyssey Vasbinder Lawerance Cruel

## 2023-01-07 NOTE — Progress Notes (Signed)
Transition of Care [facility]) - Inpatient Brief Assessment   Patient Details  Name: Michael Bonilla MRN: [id number] Date of Birth: April 09, 1956  [facility] CM/SW Contact:    Truddie Hidden, RN Phone Number: 01/07/2023, 2:18 PM   Clinical Narrative:  TOC continuing to follow patient's progress throughout discharge planning.   Transition of Care Asessment: Insurance and Status: Insurance coverage has been reviewed Patient has primary care physician: Yes Home environment has been reviewed: Home Prior level of function:: Independent Prior/Current Home Services: No current home services Social Determinants of Health Reivew: SDOH reviewed no interventions necessary Readmission risk has been reviewed: Yes Transition of care needs: no transition of care needs at this time

## 2023-01-07 NOTE — Progress Notes (Signed)
Discharge instructions given to and reviewed with patient. Patient verbalized understanding of all discharge instructions including follow up care, new prescription and when to call the doctor. Patient ambulated to front entrance with this RN. Discharged home with friend driving.

## 2023-01-07 NOTE — Discharge Instructions (Signed)
No heavy lifting until seen in follow-up clinic.  Do not lift anything more than a gallon of milk.  You may shower tomorrow after getting home..  Shower with the old bandages in place and remove immediately after showering.  Place Band-Aids to incisional groin areas for the next 3 days.  Do not drive for the next 2 weeks.  Is important you take a daily aspirin 81 mg, Plavix 75 mg and simvastatin 40 mg.  Do not skip these medications.  Follow-up with vein and vascular surgery as scheduled.

## 2023-01-07 NOTE — Progress Notes (Signed)
Made NP Pace aware that patient's heart rate has been fluctuating 53-60's therefore coreg was held this morning. Also made NP aware that patient has been having frequent PVCs. NP at bedside and gave order to go ahead and give morning dose of coreg now, ambulate patient and recheck vitals in 1 hour.
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
## 2023-01-07 NOTE — Plan of Care (Signed)
  Problem: Education: Goal: Knowledge of discharge needs will improve Outcome: Progressing   Problem: Clinical Measurements: Goal: Postoperative complications will be avoided or minimized Outcome: Progressing   Problem: Respiratory: Goal: Ability to achieve and maintain a regular respiratory rate will improve Outcome: Progressing   Problem: Skin Integrity: Goal: Demonstration of wound healing without infection will improve Outcome: Progressing   Problem: Education: Goal: Knowledge of General Education information will improve Description: Including pain rating scale, medication(s)/side effects and non-pharmacologic comfort measures Outcome: Progressing   Problem: Health Behavior/Discharge Planning: Goal: Ability to manage health-related needs will improve Outcome: Progressing   Problem: Clinical Measurements: Goal: Ability to maintain clinical measurements within normal limits will improve Outcome: Progressing Goal: Will remain free from infection Outcome: Progressing Goal: Diagnostic test results will improve Outcome: Progressing Goal: Respiratory complications will improve Outcome: Progressing   Problem: Activity: Goal: Risk for activity intolerance will decrease Outcome: Progressing

## 2023-01-08 ENCOUNTER — Encounter (INDEPENDENT_AMBULATORY_CARE_PROVIDER_SITE_OTHER): Payer: Self-pay | Admitting: Vascular Surgery

## 2023-01-08 ENCOUNTER — Encounter: Payer: Self-pay | Admitting: Vascular Surgery

## 2023-01-09 LAB — TYPE AND SCREEN
ABO/RH(D): A POS
Antibody Screen: NEGATIVE
Unit division: 0
Unit division: 0

## 2023-01-09 LAB — PREPARE RBC (CROSSMATCH)

## 2023-01-09 LAB — BPAM RBC
Blood Product Expiration Date: 202410172359
Blood Product Expiration Date: 202410172359
Unit Type and Rh: 6200
Unit Type and Rh: 6200

## 2023-01-12 ENCOUNTER — Other Ambulatory Visit (INDEPENDENT_AMBULATORY_CARE_PROVIDER_SITE_OTHER): Payer: Self-pay | Admitting: Nurse Practitioner

## 2023-01-12 MED ORDER — CIPROFLOXACIN HCL 500 MG PO TABS: 500.0000 mg | ORAL_TABLET | Freq: Two times a day (BID) | ORAL | 0 refills | Status: DC

## 2023-01-13 NOTE — Telephone Encounter (Signed)
I called and discussed with patient.  There is some concern for UTI.  Sent in Abx, if issues continue patient will contact us and we can consider urology referral.  He should have upcoming appt soon

## 2023-01-23 ENCOUNTER — Inpatient Hospital Stay: Payer: Medicare Other | Attending: Internal Medicine

## 2023-01-23 ENCOUNTER — Inpatient Hospital Stay: Payer: Medicare Other | Admitting: Internal Medicine

## 2023-01-23 ENCOUNTER — Encounter: Payer: Self-pay | Admitting: Internal Medicine

## 2023-01-23 DIAGNOSIS — Z79899 Other long term (current) drug therapy: Secondary | ICD-10-CM | POA: Insufficient documentation

## 2023-01-23 DIAGNOSIS — Z8673 Personal history of transient ischemic attack (TIA), and cerebral infarction without residual deficits: Secondary | ICD-10-CM | POA: Insufficient documentation

## 2023-01-23 DIAGNOSIS — I2510 Atherosclerotic heart disease of native coronary artery without angina pectoris: Secondary | ICD-10-CM | POA: Insufficient documentation

## 2023-01-23 DIAGNOSIS — Z85038 Personal history of other malignant neoplasm of large intestine: Secondary | ICD-10-CM | POA: Diagnosis not present

## 2023-01-23 DIAGNOSIS — N181 Chronic kidney disease, stage 1: Secondary | ICD-10-CM | POA: Diagnosis not present

## 2023-01-23 DIAGNOSIS — D696 Thrombocytopenia, unspecified: Secondary | ICD-10-CM | POA: Diagnosis not present

## 2023-01-23 DIAGNOSIS — Z808 Family history of malignant neoplasm of other organs or systems: Secondary | ICD-10-CM | POA: Diagnosis not present

## 2023-01-23 DIAGNOSIS — K219 Gastro-esophageal reflux disease without esophagitis: Secondary | ICD-10-CM | POA: Diagnosis not present

## 2023-01-23 DIAGNOSIS — I252 Old myocardial infarction: Secondary | ICD-10-CM | POA: Insufficient documentation

## 2023-01-23 DIAGNOSIS — Z8249 Family history of ischemic heart disease and other diseases of the circulatory system: Secondary | ICD-10-CM | POA: Insufficient documentation

## 2023-01-23 DIAGNOSIS — I739 Peripheral vascular disease, unspecified: Secondary | ICD-10-CM | POA: Diagnosis not present

## 2023-01-23 DIAGNOSIS — E785 Hyperlipidemia, unspecified: Secondary | ICD-10-CM | POA: Insufficient documentation

## 2023-01-23 DIAGNOSIS — F17210 Nicotine dependence, cigarettes, uncomplicated: Secondary | ICD-10-CM | POA: Diagnosis not present

## 2023-01-23 LAB — CBC WITH DIFFERENTIAL (CANCER CENTER ONLY)
Abs Immature Granulocytes: 0.05 10*3/uL (ref 0.00–0.07)
Basophils Absolute: 0.1 10*3/uL (ref 0.0–0.1)
Basophils Relative: 1 %
Eosinophils Absolute: 0.1 10*3/uL (ref 0.0–0.5)
Eosinophils Relative: 1 %
HCT: 42.6 % (ref 39.0–52.0)
Hemoglobin: 14.3 g/dL (ref 13.0–17.0)
Immature Granulocytes: 1 %
Lymphocytes Relative: 16 %
Lymphs Abs: 1.1 10*3/uL (ref 0.7–4.0)
MCH: 33.1 pg (ref 26.0–34.0)
MCHC: 33.6 g/dL (ref 30.0–36.0)
MCV: 98.6 fL (ref 80.0–100.0)
Monocytes Absolute: 0.6 10*3/uL (ref 0.1–1.0)
Monocytes Relative: 10 %
Neutro Abs: 4.6 10*3/uL (ref 1.7–7.7)
Neutrophils Relative %: 71 %
Platelet Count: 95 10*3/uL — ABNORMAL LOW (ref 150–400)
RBC: 4.32 MIL/uL (ref 4.22–5.81)
RDW: 12.2 % (ref 11.5–15.5)
WBC Count: 6.4 10*3/uL (ref 4.0–10.5)
nRBC: 0 % (ref 0.0–0.2)

## 2023-01-23 LAB — CMP (CANCER CENTER ONLY)
ALT: 20 U/L (ref 0–44)
AST: 25 U/L (ref 15–41)
Albumin: 4 g/dL (ref 3.5–5.0)
Alkaline Phosphatase: 80 U/L (ref 38–126)
Anion gap: 7 (ref 5–15)
BUN: 13 mg/dL (ref 8–23)
CO2: 27 mmol/L (ref 22–32)
Calcium: 9.1 mg/dL (ref 8.9–10.3)
Chloride: 101 mmol/L (ref 98–111)
Creatinine: 0.95 mg/dL (ref 0.61–1.24)
GFR, Estimated: >60 mL/min (ref >60)
Glucose, Bld: 129 mg/dL — ABNORMAL HIGH (ref 70–99)
Potassium: 4.7 mmol/L (ref 3.5–5.1)
Sodium: 135 mmol/L (ref 135–145)
Total Bilirubin: 0.8 mg/dL (ref 0.3–1.2)
Total Protein: 7 g/dL (ref 6.5–8.1)

## 2023-01-23 NOTE — Assessment & Plan Note (Addendum)
#   Thrombocytopenia: Mild/moderate [2016-1320-130; JAN-AUG 2024- 80s]; normal Hb/ WBC. SEP 2024- S/p dexamethasone 40 mg x 4 days-however no significant response noted as platelets 88 not significantly improved from baseline of 92. July 2024 CT scan-no evidence of splenomegaly.  # Given elevated immature platelet fraction-to suspect ITP; but since platelets did not improve significantly after steroids-alternative etiology could be considered.  Discussed regarding a bone marrow biopsy for further evaluation is significant decline in platelet count noted. Post vascular surgery -September 2024 platelets in the 40s.  However today- platelets 95.  For now continue surveillance without any treatment.  # DISPOSITION: # follow up in 6 months- MD; labs- cbc/bmp; LDH-Dr.B  Cc; Mr.Farrug, NP-[place], [facility]-
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
## 2023-01-23 NOTE — Progress Notes (Signed)
Bruising: no Bleeding from gums: no  AAA repair [facility] 01/06/23.  Having trouble sleeping, takes tyleonl pm, not helping.  Has occasional instant breakfast drink.
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
## 2023-02-05 ENCOUNTER — Ambulatory Visit (INDEPENDENT_AMBULATORY_CARE_PROVIDER_SITE_OTHER): Payer: Medicare Other | Admitting: Vascular Surgery

## 2023-02-05 ENCOUNTER — Encounter (INDEPENDENT_AMBULATORY_CARE_PROVIDER_SITE_OTHER): Payer: Self-pay | Admitting: Vascular Surgery

## 2023-02-05 VITALS — BP 131/80 | HR 64 | Resp 16 | Wt 199.0 lb

## 2023-02-05 DIAGNOSIS — N3000 Acute cystitis without hematuria: Secondary | ICD-10-CM

## 2023-02-05 DIAGNOSIS — N390 Urinary tract infection, site not specified: Secondary | ICD-10-CM | POA: Insufficient documentation

## 2023-02-05 DIAGNOSIS — I739 Peripheral vascular disease, unspecified: Secondary | ICD-10-CM

## 2023-02-05 DIAGNOSIS — I7143 Infrarenal abdominal aortic aneurysm, without rupture: Secondary | ICD-10-CM

## 2023-02-05 MED ORDER — CIPROFLOXACIN HCL 500 MG PO TABS: 500.0000 mg | ORAL_TABLET | Freq: Two times a day (BID) | ORAL | 0 refills | Status: DC

## 2023-02-09 ENCOUNTER — Other Ambulatory Visit: Payer: Self-pay

## 2023-02-09 ENCOUNTER — Encounter: Payer: Self-pay | Admitting: Cardiology

## 2023-02-09 ENCOUNTER — Ambulatory Visit: Payer: Medicare Other | Attending: Cardiology | Admitting: Cardiology

## 2023-02-09 VITALS — BP 118/70 | HR 62 | Ht 72.0 in | Wt 200.4 lb

## 2023-02-09 DIAGNOSIS — I255 Ischemic cardiomyopathy: Secondary | ICD-10-CM | POA: Diagnosis not present

## 2023-02-09 DIAGNOSIS — I2510 Atherosclerotic heart disease of native coronary artery without angina pectoris: Secondary | ICD-10-CM

## 2023-02-09 DIAGNOSIS — F17200 Nicotine dependence, unspecified, uncomplicated: Secondary | ICD-10-CM | POA: Diagnosis not present

## 2023-02-09 MED ORDER — ENTRESTO 49-51 MG PO TABS: 1.0000 | ORAL_TABLET | Freq: Two times a day (BID) | ORAL | 3 refills | Status: DC

## 2023-02-09 MED ORDER — ATORVASTATIN CALCIUM 40 MG PO TABS
40.0000 mg | ORAL_TABLET | Freq: Every day | ORAL | 3 refills | Status: DC
Start: 2023-02-09 — End: 2024-02-02
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
## 2023-02-09 NOTE — Patient Instructions (Signed)
Medication Instructions:   INCREASE Entresto - Take one tablet ( 49-51mg ) by mouth daily.  2. STOP Simvastatin  3. START Atorvastatin - Take one tablet ( 40mg ) by mouth daily.   *If you need a refill on your cardiac medications before your next appointment, please call your pharmacy*   Lab Work:  Your physician recommends you have labs - BMP / CBC  If you have labs (blood work) drawn today and your tests are completely normal, you will receive your results only by: MyChart Message (if you have MyChart) OR A paper copy in the mail If you have any lab test that is abnormal or we need to change your treatment, we will call you to review the results.   Testing/Procedures:   [place] A DEPT OF [facility] [place] [address] Shearon Stalls [address] [place] [address] Dept: [phone] Loc: [phone]  Michael Bonilla [facility] [place] [facility]  02/09/2023  You are scheduled for a Cardiac Catheterization on Thursday, October 31 with Dr. Bryan Lemma.  1. Please arrive at the Heart & Vascular Center Entrance of [facility], [address], [place] [address] at 9:30 AM (This is 1 hour(s) prior to your procedure time).  Proceed to the Check-In Desk directly inside the entrance.  Procedure Parking: Use the entrance off of the [address] side of the hospital. Turn right upon entering and follow the driveway to parking that is directly in front of the Heart & Vascular Center. There is no valet parking available at this entrance, however there is an awning directly in front of the Heart & Vascular Center for drop off/ pick up for patients.  Special note: Every effort is made to have your procedure done on time. Please understand that emergencies sometimes delay scheduled procedures.  2. Diet: Do not eat solid foods after midnight.  The patient may have clear liquids until 5am upon the day of the procedure.  3. Labs: You will need to have blood  drawn TODAY  4. Medication instructions in preparation for your procedure:   On the morning of your procedure, take your Aspirin 81 mg and any morning medicines NOT listed above.  You may use sips of water.  5. Plan to go home the same day, you will only stay overnight if medically necessary. 6. Bring a current list of your medications and current insurance cards. 7. You MUST have a responsible person to drive you home. 8. Someone MUST be with you the first 24 hours after you arrive home or your discharge will be delayed. 9. Please wear clothes that are easy to get on and off and wear slip-on shoes.  Thank you for allowing [place] to care for you!   -- [place] [facility]    Follow-Up: At [facility], you and your health needs are our priority.  As part of our continuing mission to provide you with exceptional heart care, we have created designated Provider Care Teams.  These Care Teams include your primary Cardiologist (physician) and Advanced Practice Providers (APPs -  Physician Assistants and Nurse Practitioners) who all work together to provide you with the care you need, when you need it.  We recommend signing up for the patient portal called "MyChart".  Sign up information is provided on this After Visit Summary.  MyChart is used to connect with patients for Virtual Visits (Telemedicine).  Patients are able to view lab/test results, encounter notes, upcoming appointments, etc.  Non-urgent messages can be sent to your provider as well.   To learn more about what you can do with MyChart, go to [email or web address].    Your next appointment:   6 week(s)  Provider:   You may see Debbe Odea, MD or one of the following Advanced Practice Providers on your designated Care Team:   Nicolasa Ducking, NP Eula Listen, PA-C Cadence Fransico Michael, PA-C Charlsie Quest, NP
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
## 2023-02-10 ENCOUNTER — Encounter: Payer: Self-pay | Admitting: Cardiology

## 2023-02-10 ENCOUNTER — Telehealth: Payer: Self-pay | Admitting: Cardiology

## 2023-02-10 LAB — BASIC METABOLIC PANEL
BUN/Creatinine Ratio: 9 — ABNORMAL LOW (ref 10–24)
BUN: 9 mg/dL (ref 8–27)
CO2: 24 mmol/L (ref 20–29)
Calcium: 9.7 mg/dL (ref 8.6–10.2)
Chloride: 102 mmol/L (ref 96–106)
Creatinine, Ser: 1.01 mg/dL (ref 0.76–1.27)
Glucose: 129 mg/dL — ABNORMAL HIGH (ref 70–99)
Potassium: 4.9 mmol/L (ref 3.5–5.2)
Sodium: 142 mmol/L (ref 134–144)
eGFR: 81 mL/min/{1.73_m2} (ref >59)

## 2023-02-10 LAB — CBC
Hematocrit: 44.6 % (ref 37.5–51.0)
Hemoglobin: 14.8 g/dL (ref 13.0–17.7)
MCH: 33 pg (ref 26.6–33.0)
MCHC: 33.2 g/dL (ref 31.5–35.7)
MCV: 100 fL — ABNORMAL HIGH (ref 79–97)
Platelets: 66 10*3/uL — CL (ref 150–450)
RBC: 4.48 x10E6/uL (ref 4.14–5.80)
RDW: 12.3 % (ref 11.6–15.4)
WBC: 7.9 10*3/uL (ref 3.4–10.8)

## 2023-02-10 NOTE — Telephone Encounter (Signed)
Pt c/o medication issue:  1. Name of Medication:   sacubitril-valsartan (ENTRESTO) 49-51 MG    2. How are you currently taking this medication (dosage and times per day)?   Take 1 tablet by mouth 2 (two) times daily    3. Are you having a reaction (difficulty breathing--STAT)? No  4. What is your medication issue? Pt states that since dosage increase he has been experiencing light headedness. He would like a callback regarding this matter. Please advise

## 2023-02-10 NOTE — Telephone Encounter (Signed)
Please see mychart encounter

## 2023-02-13 ENCOUNTER — Ambulatory Visit
Admission: RE | Admit: 2023-02-13 | Discharge: 2023-02-13 | Disposition: A | Discharge status: Home / Self Care | Payer: Medicare Other | Source: Ambulatory Visit | Attending: Cardiology | Admitting: Cardiology

## 2023-02-13 ENCOUNTER — Other Ambulatory Visit: Payer: Self-pay

## 2023-02-13 DIAGNOSIS — R9431 Abnormal electrocardiogram [ECG] [EKG]: Secondary | ICD-10-CM | POA: Insufficient documentation

## 2023-02-19 ENCOUNTER — Ambulatory Visit
Admission: RE | Admit: 2023-02-19 | Discharge: 2023-02-19 | Disposition: A | Discharge status: Home / Self Care | Payer: Medicare Other | Attending: Cardiology | Admitting: Cardiology

## 2023-02-19 ENCOUNTER — Other Ambulatory Visit: Payer: Self-pay

## 2023-02-19 ENCOUNTER — Encounter: Payer: Self-pay | Admitting: Cardiology

## 2023-02-19 ENCOUNTER — Encounter
Admission: RE | Disposition: A | Discharge status: Home / Self Care | Payer: Self-pay | Source: Home / Self Care | Attending: Cardiology

## 2023-02-19 DIAGNOSIS — I255 Ischemic cardiomyopathy: Secondary | ICD-10-CM | POA: Diagnosis not present

## 2023-02-19 DIAGNOSIS — F17210 Nicotine dependence, cigarettes, uncomplicated: Secondary | ICD-10-CM | POA: Diagnosis not present

## 2023-02-19 DIAGNOSIS — R9439 Abnormal result of other cardiovascular function study: Secondary | ICD-10-CM | POA: Diagnosis not present

## 2023-02-19 DIAGNOSIS — I252 Old myocardial infarction: Secondary | ICD-10-CM | POA: Diagnosis not present

## 2023-02-19 DIAGNOSIS — Z8679 Personal history of other diseases of the circulatory system: Secondary | ICD-10-CM | POA: Diagnosis not present

## 2023-02-19 DIAGNOSIS — I429 Cardiomyopathy, unspecified: Secondary | ICD-10-CM | POA: Insufficient documentation

## 2023-02-19 DIAGNOSIS — Z955 Presence of coronary angioplasty implant and graft: Secondary | ICD-10-CM | POA: Insufficient documentation

## 2023-02-19 DIAGNOSIS — I5022 Chronic systolic (congestive) heart failure: Secondary | ICD-10-CM | POA: Insufficient documentation

## 2023-02-19 DIAGNOSIS — I130 Hypertensive heart and chronic kidney disease with heart failure and stage 1 through stage 4 chronic kidney disease, or unspecified chronic kidney disease: Secondary | ICD-10-CM | POA: Insufficient documentation

## 2023-02-19 DIAGNOSIS — Y832 Surgical operation with anastomosis, bypass or graft as the cause of abnormal reaction of the patient, or of later complication, without mention of misadventure at the time of the procedure: Secondary | ICD-10-CM | POA: Diagnosis not present

## 2023-02-19 DIAGNOSIS — T82855A Stenosis of coronary artery stent, initial encounter: Secondary | ICD-10-CM | POA: Insufficient documentation

## 2023-02-19 DIAGNOSIS — Z79899 Other long term (current) drug therapy: Secondary | ICD-10-CM | POA: Diagnosis not present

## 2023-02-19 DIAGNOSIS — I2510 Atherosclerotic heart disease of native coronary artery without angina pectoris: Secondary | ICD-10-CM | POA: Diagnosis not present

## 2023-02-19 DIAGNOSIS — N181 Chronic kidney disease, stage 1: Secondary | ICD-10-CM | POA: Insufficient documentation

## 2023-02-19 DIAGNOSIS — I2584 Coronary atherosclerosis due to calcified coronary lesion: Secondary | ICD-10-CM | POA: Diagnosis not present

## 2023-02-19 HISTORY — PX: RIGHT/LEFT HEART CATH AND CORONARY ANGIOGRAPHY: CATH118266

## 2023-02-19 LAB — POCT I-STAT 7, (LYTES, BLD GAS, ICA,H+H)
Acid-base deficit: 1 mmol/L (ref 0.0–2.0)
Bicarbonate: 23.1 mmol/L (ref 20.0–28.0)
Calcium, Ion: 1.12 mmol/L — ABNORMAL LOW (ref 1.15–1.40)
HCT: 39 % (ref 39.0–52.0)
Hemoglobin: 13.3 g/dL (ref 13.0–17.0)
O2 Saturation: 94 %
Potassium: 3.3 mmol/L — ABNORMAL LOW (ref 3.5–5.1)
Sodium: 142 mmol/L (ref 135–145)
TCO2: 24 mmol/L (ref 22–32)
pCO2 arterial: 35.6 mm[Hg] (ref 32–48)
pH, Arterial: 7.421 (ref 7.35–7.45)
pO2, Arterial: 68 mm[Hg] — ABNORMAL LOW (ref 83–108)

## 2023-02-19 LAB — POCT I-STAT EG7
Acid-Base Excess: 0 mmol/L (ref 0.0–2.0)
Acid-Base Excess: 1 mmol/L (ref 0.0–2.0)
Bicarbonate: 24.5 mmol/L (ref 20.0–28.0)
Bicarbonate: 25.2 mmol/L (ref 20.0–28.0)
Calcium, Ion: 1.14 mmol/L — ABNORMAL LOW (ref 1.15–1.40)
Calcium, Ion: 1.13 mmol/L — ABNORMAL LOW (ref 1.15–1.40)
HCT: 40 % (ref 39.0–52.0)
HCT: 40 % (ref 39.0–52.0)
Hemoglobin: 13.6 g/dL (ref 13.0–17.0)
Hemoglobin: 13.6 g/dL (ref 13.0–17.0)
O2 Saturation: 70 %
O2 Saturation: 69 %
Potassium: 3.2 mmol/L — ABNORMAL LOW (ref 3.5–5.1)
Potassium: 3.2 mmol/L — ABNORMAL LOW (ref 3.5–5.1)
Sodium: 141 mmol/L (ref 135–145)
Sodium: 141 mmol/L (ref 135–145)
TCO2: 26 mmol/L (ref 22–32)
TCO2: 26 mmol/L (ref 22–32)
pCO2, Ven: 39.4 mm[Hg] — ABNORMAL LOW (ref 44–60)
pCO2, Ven: 39.8 mm[Hg] — ABNORMAL LOW (ref 44–60)
pH, Ven: 7.402 (ref 7.25–7.43)
pH, Ven: 7.409 (ref 7.25–7.43)
pO2, Ven: 37 mm[Hg] (ref 32–45)
pO2, Ven: 36 mm[Hg] (ref 32–45)

## 2023-02-19 SURGERY — RIGHT/LEFT HEART CATH AND CORONARY ANGIOGRAPHY
Anesthesia: Moderate Sedation | Laterality: Bilateral

## 2023-02-19 MED ORDER — IOHEXOL 300 MG/ML  SOLN
INTRAMUSCULAR | Status: DC | PRN
  Administered 2023-02-19: 43 mL

## 2023-02-19 MED ORDER — FENTANYL CITRATE (PF) 100 MCG/2ML IJ SOLN
INTRAMUSCULAR | Status: AC
  Filled 2023-02-19: qty 2

## 2023-02-19 MED ORDER — MORPHINE SULFATE (PF) 2 MG/ML IV SOLN: 1.0000 mg | INTRAVENOUS | Status: DC | PRN

## 2023-02-19 MED ORDER — VERAPAMIL HCL 2.5 MG/ML IV SOLN
INTRAVENOUS | Status: DC | PRN
  Administered 2023-02-19: 2.5 mg via INTRAVENOUS

## 2023-02-19 MED ORDER — HYDRALAZINE HCL 20 MG/ML IJ SOLN: 10.0000 mg | INTRAMUSCULAR | Status: DC | PRN

## 2023-02-19 MED ORDER — HEPARIN SODIUM (PORCINE) 1000 UNIT/ML IJ SOLN
INTRAMUSCULAR | Status: DC | PRN
  Administered 2023-02-19: 3000 [IU] via INTRAVENOUS

## 2023-02-19 MED ORDER — ONDANSETRON HCL 4 MG/2ML IJ SOLN: 4.0000 mg | Freq: Four times a day (QID) | INTRAMUSCULAR | Status: DC | PRN

## 2023-02-19 MED ORDER — SODIUM CHLORIDE 0.9 % IV SOLN: 250.0000 mL | INTRAVENOUS | Status: DC | PRN

## 2023-02-19 MED ORDER — VERAPAMIL HCL 2.5 MG/ML IV SOLN
INTRAVENOUS | Status: AC
Start: 2023-02-19 — End: ?
  Filled 2023-02-19: qty 2

## 2023-02-19 MED ORDER — SODIUM CHLORIDE 0.9 % IV SOLN: INTRAVENOUS | Status: DC

## 2023-02-19 MED ORDER — ACETAMINOPHEN 325 MG PO TABS
650.0000 mg | ORAL_TABLET | ORAL | Status: DC | PRN
Start: 2023-02-19 — End: 2023-02-19

## 2023-02-19 MED ORDER — SODIUM CHLORIDE 0.9% FLUSH: 3.0000 mL | Freq: Two times a day (BID) | INTRAVENOUS | Status: DC

## 2023-02-19 MED ORDER — HEPARIN (PORCINE) IN NACL 2000-0.9 UNIT/L-% IV SOLN
INTRAVENOUS | Status: DC | PRN
  Administered 2023-02-19: 1000 mL

## 2023-02-19 MED ORDER — MIDAZOLAM HCL 2 MG/2ML IJ SOLN
INTRAMUSCULAR | Status: AC
  Filled 2023-02-19: qty 2

## 2023-02-19 MED ORDER — SODIUM CHLORIDE 0.9% FLUSH: 3.0000 mL | INTRAVENOUS | Status: DC | PRN

## 2023-02-19 MED ORDER — FENTANYL CITRATE (PF) 100 MCG/2ML IJ SOLN
INTRAMUSCULAR | Status: DC | PRN
  Administered 2023-02-19: 25 ug via INTRAVENOUS

## 2023-02-19 MED ORDER — HEPARIN SODIUM (PORCINE) 1000 UNIT/ML IJ SOLN
INTRAMUSCULAR | Status: AC
Start: 2023-02-19 — End: ?
  Filled 2023-02-19: qty 10

## 2023-02-19 MED ORDER — LABETALOL HCL 5 MG/ML IV SOLN: 10.0000 mg | INTRAVENOUS | Status: DC | PRN

## 2023-02-19 MED ORDER — MIDAZOLAM HCL 2 MG/2ML IJ SOLN
INTRAMUSCULAR | Status: DC | PRN
  Administered 2023-02-19 (×2): 1 mg via INTRAVENOUS

## 2023-02-19 SURGICAL SUPPLY — 12 items
CATH BALLN WEDGE 5F 110CM (CATHETERS) IMPLANT
CATH INFINITI AMBI 5FR TG (CATHETERS) IMPLANT
DEVICE RAD TR BAND REGULAR (VASCULAR PRODUCTS) IMPLANT
DRAPE BRACHIAL (DRAPES) IMPLANT
GLIDESHEATH SLEND SS 6F .021 (SHEATH) IMPLANT
GUIDEWIRE INQWIRE 1.5J.035X260 (WIRE) IMPLANT
INQWIRE 1.5J .035X260CM (WIRE) ×1
PACK CARDIAC CATH (CUSTOM PROCEDURE TRAY) ×1 IMPLANT
PROTECTION STATION PRESSURIZED (MISCELLANEOUS) ×1
SET ATX-X65L (MISCELLANEOUS) IMPLANT
SHEATH GLIDE SLENDER 4/5FR (SHEATH) IMPLANT
STATION PROTECTION PRESSURIZED (MISCELLANEOUS) IMPLANT

## 2023-02-19 NOTE — Interval H&P Note (Signed)
History and Physical Interval Note:  02/19/2023 11:30 AM  Michael Bonilla  has presented today for surgery, with the diagnosis of R and L Cath   CAD.  The various methods of treatment have been discussed with the patient and family. After consideration of risks, benefits and other options for treatment, the patient has consented to  Procedure(s): RIGHT/LEFT HEART CATH AND CORONARY ANGIOGRAPHY (Bilateral)  PERCUTANEOUS CORONARY INTERVENTION   as a surgical intervention.  The patient's history has been reviewed, patient examined, no change in status, stable for surgery.  I have reviewed the patient's chart and labs.  Questions were answered to the patient's satisfaction.    Cath Lab Visit (complete for each Cath Lab visit)  Clinical Evaluation Leading to the Procedure:   ACS: No.  Non-ACS:    Anginal Classification: No Symptoms  Anti-ischemic medical therapy: Minimal Therapy (1 class of medications)  Non-Invasive Test Results: Equivocal test results  Prior CABG: No previous CABG   Bryan Lemma

## 2023-02-20 ENCOUNTER — Encounter: Payer: Self-pay | Admitting: Cardiology

## 2023-03-08 ENCOUNTER — Other Ambulatory Visit (INDEPENDENT_AMBULATORY_CARE_PROVIDER_SITE_OTHER): Payer: Self-pay | Admitting: Vascular Surgery

## 2023-03-09 ENCOUNTER — Encounter (INDEPENDENT_AMBULATORY_CARE_PROVIDER_SITE_OTHER): Payer: Self-pay | Admitting: Vascular Surgery

## 2023-03-09 ENCOUNTER — Other Ambulatory Visit (INDEPENDENT_AMBULATORY_CARE_PROVIDER_SITE_OTHER): Payer: Self-pay

## 2023-03-09 MED ORDER — CIPROFLOXACIN HCL 500 MG PO TABS: 500.0000 mg | ORAL_TABLET | Freq: Two times a day (BID) | ORAL | 0 refills | Status: DC

## 2023-04-02 ENCOUNTER — Encounter: Payer: Self-pay | Admitting: Cardiology

## 2023-04-02 ENCOUNTER — Ambulatory Visit: Payer: Medicare Other | Attending: Cardiology | Admitting: Cardiology

## 2023-04-02 VITALS — BP 148/80 | HR 66 | Ht 72.0 in | Wt 197.8 lb

## 2023-04-02 DIAGNOSIS — F17200 Nicotine dependence, unspecified, uncomplicated: Secondary | ICD-10-CM

## 2023-04-02 DIAGNOSIS — I255 Ischemic cardiomyopathy: Secondary | ICD-10-CM | POA: Diagnosis not present

## 2023-04-02 DIAGNOSIS — I2510 Atherosclerotic heart disease of native coronary artery without angina pectoris: Secondary | ICD-10-CM | POA: Diagnosis not present

## 2023-04-02 MED ORDER — SPIRONOLACTONE 25 MG PO TABS: 12.5000 mg | ORAL_TABLET | Freq: Every day | ORAL | 0 refills | Status: DC

## 2023-04-02 NOTE — Patient Instructions (Signed)
Medication Instructions:   START Spirolactone - Take half tablet ( 12.5mg ) by mouth daily.   *If you need a refill on your cardiac medications before your next appointment, please call your pharmacy*   Lab Work:  Your provider would like for you to return in 10 days  to have the following labs drawn: BMP.   Please go to [facility] [address], [place] [address] You do not need an appointment.  They are open from 7:30 am-4 pm.  Lunch from 1:00 pm- 2:00 pm You DO NOT need to be fasting.   If you have labs (blood work) drawn today and your tests are completely normal, you will receive your results only by: MyChart Message (if you have MyChart) OR A paper copy in the mail If you have any lab test that is abnormal or we need to change your treatment, we will call you to review the results.   Testing/Procedures:  None Ordered   Follow-Up: At [facility], you and your health needs are our priority.  As part of our continuing mission to provide you with exceptional heart care, we have created designated Provider Care Teams.  These Care Teams include your primary Cardiologist (physician) and Advanced Practice Providers (APPs -  Physician Assistants and Nurse Practitioners) who all work together to provide you with the care you need, when you need it.  We recommend signing up for the patient portal called "MyChart".  Sign up information is provided on this After Visit Summary.  MyChart is used to connect with patients for Virtual Visits (Telemedicine).  Patients are able to view lab/test results, encounter notes, upcoming appointments, etc.  Non-urgent messages can be sent to your provider as well.   To learn more about what you can do with MyChart, go to [email or web address].    Your next appointment:   6 week(s)  Provider:   You may see Debbe Odea, MD or one of the following Advanced Practice Providers on your  designated Care Team:   Nicolasa Ducking, NP Eula Listen, PA-C Cadence Fransico Michael, PA-C Charlsie Quest, NP Carlos Levering, NP
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
## 2023-05-04 ENCOUNTER — Ambulatory Visit (INDEPENDENT_AMBULATORY_CARE_PROVIDER_SITE_OTHER): Payer: Medicare Other

## 2023-05-04 ENCOUNTER — Encounter (INDEPENDENT_AMBULATORY_CARE_PROVIDER_SITE_OTHER): Payer: Self-pay | Admitting: Vascular Surgery

## 2023-05-04 ENCOUNTER — Ambulatory Visit (INDEPENDENT_AMBULATORY_CARE_PROVIDER_SITE_OTHER): Payer: Medicare Other | Admitting: Vascular Surgery

## 2023-05-04 ENCOUNTER — Other Ambulatory Visit: Payer: Self-pay | Admitting: *Deleted

## 2023-05-04 VITALS — BP 138/79 | HR 74 | Resp 16 | Wt 195.0 lb

## 2023-05-04 DIAGNOSIS — I739 Peripheral vascular disease, unspecified: Secondary | ICD-10-CM

## 2023-05-04 DIAGNOSIS — I70213 Atherosclerosis of native arteries of extremities with intermittent claudication, bilateral legs: Secondary | ICD-10-CM

## 2023-05-04 DIAGNOSIS — I255 Ischemic cardiomyopathy: Secondary | ICD-10-CM

## 2023-05-04 DIAGNOSIS — I70219 Atherosclerosis of native arteries of extremities with intermittent claudication, unspecified extremity: Secondary | ICD-10-CM | POA: Insufficient documentation

## 2023-05-04 DIAGNOSIS — I7143 Infrarenal abdominal aortic aneurysm, without rupture: Secondary | ICD-10-CM

## 2023-05-04 DIAGNOSIS — I25119 Atherosclerotic heart disease of native coronary artery with unspecified angina pectoris: Secondary | ICD-10-CM

## 2023-05-04 DIAGNOSIS — I2510 Atherosclerotic heart disease of native coronary artery without angina pectoris: Secondary | ICD-10-CM | POA: Diagnosis not present

## 2023-05-04 DIAGNOSIS — K219 Gastro-esophageal reflux disease without esophagitis: Secondary | ICD-10-CM

## 2023-05-04 DIAGNOSIS — E782 Mixed hyperlipidemia: Secondary | ICD-10-CM

## 2023-05-05 LAB — BASIC METABOLIC PANEL
BUN/Creatinine Ratio: 8 — ABNORMAL LOW (ref 10–24)
BUN: 8 mg/dL (ref 8–27)
CO2: 23 mmol/L (ref 20–29)
Calcium: 8.8 mg/dL (ref 8.6–10.2)
Chloride: 100 mmol/L (ref 96–106)
Creatinine, Ser: 1.04 mg/dL (ref 0.76–1.27)
Glucose: 119 mg/dL — ABNORMAL HIGH (ref 70–99)
Potassium: 3.8 mmol/L (ref 3.5–5.2)
Sodium: 141 mmol/L (ref 134–144)
eGFR: 78 mL/min/{1.73_m2} (ref >59)

## 2023-05-06 LAB — VAS US ABI WITH/WO TBI
Left ABI: 0.68
Right ABI: 0.89

## 2023-05-14 ENCOUNTER — Ambulatory Visit: Payer: Medicare Other | Admitting: Physician Assistant
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
## 2023-05-25 ENCOUNTER — Encounter: Payer: Self-pay | Admitting: Physician Assistant

## 2023-05-25 ENCOUNTER — Ambulatory Visit: Payer: Medicare Other | Attending: Physician Assistant | Admitting: Physician Assistant

## 2023-05-25 VITALS — BP 116/72 | HR 65 | Ht 72.0 in | Wt 185.2 lb

## 2023-05-25 DIAGNOSIS — I7143 Infrarenal abdominal aortic aneurysm, without rupture: Secondary | ICD-10-CM

## 2023-05-25 DIAGNOSIS — I10 Essential (primary) hypertension: Secondary | ICD-10-CM

## 2023-05-25 DIAGNOSIS — F17200 Nicotine dependence, unspecified, uncomplicated: Secondary | ICD-10-CM

## 2023-05-25 DIAGNOSIS — I255 Ischemic cardiomyopathy: Secondary | ICD-10-CM

## 2023-05-25 DIAGNOSIS — I739 Peripheral vascular disease, unspecified: Secondary | ICD-10-CM

## 2023-05-25 DIAGNOSIS — I2510 Atherosclerotic heart disease of native coronary artery without angina pectoris: Secondary | ICD-10-CM | POA: Diagnosis not present

## 2023-05-25 DIAGNOSIS — I5020 Unspecified systolic (congestive) heart failure: Secondary | ICD-10-CM | POA: Diagnosis not present

## 2023-05-25 DIAGNOSIS — E785 Hyperlipidemia, unspecified: Secondary | ICD-10-CM

## 2023-05-25 NOTE — Patient Instructions (Signed)
Medication Instructions:   *If you need a refill on your cardiac medications before your next appointment, please call your pharmacy*    Follow-Up: At [facility], you and your health needs are our priority.  As part of our continuing mission to provide you with exceptional heart care, we have created designated Provider Care Teams.  These Care Teams include your primary Cardiologist (physician) and Advanced Practice Providers (APPs -  Physician Assistants and Nurse Practitioners) who all work together to provide you with the care you need, when you need it.  We recommend signing up for the patient portal called "MyChart".  Sign up information is provided on this After Visit Summary.  MyChart is used to connect with patients for Virtual Visits (Telemedicine).  Patients are able to view lab/test results, encounter notes, upcoming appointments, etc.  Non-urgent messages can be sent to your provider as well.   To learn more about what you can do with MyChart, go to [email or web address].    Your next appointment:   3 month(s)  Provider:   You may see Eula Listen, PA-C or one of the following Advanced Practice Providers on your designated Care Team:   Nicolasa Ducking, NP Cadence Fransico Michael, PA-C Charlsie Quest, NP Carlos Levering, NP     Your physician recommends that you continue on your current medications as directed. Please refer to the Current Medication list given to you today.

## 2023-06-22 ENCOUNTER — Other Ambulatory Visit: Payer: Self-pay | Admitting: *Deleted

## 2023-06-22 MED ORDER — SPIRONOLACTONE 25 MG PO TABS: 12.5000 mg | ORAL_TABLET | Freq: Every day | ORAL | 0 refills | Status: DC

## 2023-07-24 ENCOUNTER — Other Ambulatory Visit: Payer: Medicare Other

## 2023-07-24 ENCOUNTER — Ambulatory Visit: Payer: Medicare Other | Admitting: Internal Medicine

## 2023-08-11 ENCOUNTER — Encounter: Payer: Self-pay | Admitting: Internal Medicine

## 2023-08-11 ENCOUNTER — Encounter: Payer: Self-pay | Admitting: Cardiology

## 2023-08-11 ENCOUNTER — Other Ambulatory Visit (HOSPITAL_COMMUNITY): Payer: Self-pay

## 2023-08-11 ENCOUNTER — Encounter: Payer: Self-pay | Admitting: Pharmacy Technician

## 2023-08-11 ENCOUNTER — Telehealth: Payer: Self-pay | Admitting: Pharmacy Technician

## 2023-08-11 ENCOUNTER — Telehealth: Payer: Self-pay | Admitting: Cardiology

## 2023-08-11 NOTE — Telephone Encounter (Signed)
 Pt c/o medication issue:  1. Name of Medication:   sacubitril -valsartan  (ENTRESTO ) 49-51 MG   2. How are you currently taking this medication (dosage and times per day)?   As prescribed  3. Are you having a reaction (difficulty breathing--STAT)?   No  4. What is your medication issue?   Patient stated his insurance changed this year and this medication is too expensive.  Patient wants to get alternate medication.  Patient noted he has 1 week left of this medication.

## 2023-08-11 NOTE — Telephone Encounter (Signed)
 Patient Advocate Encounter   The patient was approved for a [employer] grant that will help cover the cost of entresto  Total amount awarded, [id number].  Effective: 07/12/23 - 07/10/24   ZOX:[id number] [id number] WGNFA:[id number] [id number] [employer] ID: [id number]   Pharmacy provided with approval and processing information. Patient informed via telephone
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
## 2023-08-26 ENCOUNTER — Ambulatory Visit: Payer: Medicare Other | Admitting: Physician Assistant
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
## 2023-09-02 ENCOUNTER — Ambulatory Visit: Admitting: Physician Assistant

## 2023-09-02 ENCOUNTER — Encounter: Payer: Self-pay | Admitting: Cardiology

## 2023-09-08 ENCOUNTER — Encounter (INDEPENDENT_AMBULATORY_CARE_PROVIDER_SITE_OTHER): Payer: Self-pay

## 2023-09-11 ENCOUNTER — Other Ambulatory Visit: Payer: Self-pay

## 2023-09-15 ENCOUNTER — Other Ambulatory Visit: Payer: Self-pay | Admitting: *Deleted

## 2023-09-15 ENCOUNTER — Other Ambulatory Visit: Payer: Self-pay

## 2023-09-15 ENCOUNTER — Encounter: Payer: Self-pay | Admitting: Cardiology

## 2023-09-15 MED ORDER — ENTRESTO 49-51 MG PO TABS: 1.0000 | ORAL_TABLET | Freq: Two times a day (BID) | ORAL | 3 refills | Status: DC

## 2023-09-15 MED ORDER — ENTRESTO 49-51 MG PO TABS: 1.0000 | ORAL_TABLET | Freq: Two times a day (BID) | ORAL | 11 refills | Status: AC

## 2023-09-16 ENCOUNTER — Other Ambulatory Visit: Payer: Self-pay

## 2023-09-21 ENCOUNTER — Other Ambulatory Visit: Payer: Self-pay

## 2023-09-21 MED ORDER — SPIRONOLACTONE 25 MG PO TABS: 12.5000 mg | ORAL_TABLET | Freq: Every day | ORAL | 0 refills | Status: DC

## 2023-09-24 ENCOUNTER — Ambulatory Visit: Attending: Cardiology | Admitting: Cardiology

## 2023-09-24 ENCOUNTER — Encounter: Payer: Self-pay | Admitting: Cardiology

## 2023-09-24 VITALS — BP 138/70 | HR 67 | Ht 72.0 in | Wt 197.4 lb

## 2023-09-24 DIAGNOSIS — I429 Cardiomyopathy, unspecified: Secondary | ICD-10-CM | POA: Diagnosis not present

## 2023-09-24 DIAGNOSIS — I25119 Atherosclerotic heart disease of native coronary artery with unspecified angina pectoris: Secondary | ICD-10-CM

## 2023-09-24 DIAGNOSIS — I10 Essential (primary) hypertension: Secondary | ICD-10-CM

## 2023-09-24 NOTE — Patient Instructions (Signed)
 Medication Instructions:  Your physician recommends the following medication changes.  INCREASE: Entresto  to 49-51 mg   *If you need a refill on your cardiac medications before your next appointment, please call your pharmacy*  Lab Work: No labs ordered today  If you have labs (blood work) drawn today and your tests are completely normal, you will receive your results only by: MyChart Message (if you have MyChart) OR A paper copy in the mail If you have any lab test that is abnormal or we need to change your treatment, we will call you to review the results.  Testing/Procedures: Your physician has requested that you have an echocardiogram. Echocardiography is a painless test that uses sound waves to create images of your heart. It provides your doctor with information about the size and shape of your heart and how well your heart's chambers and valves are working.   You may receive an ultrasound enhancing agent through an IV if needed to better visualize your heart during the echo. This procedure takes approximately one hour.  There are no restrictions for this procedure.  This will take place at [address] [facility] [address]) [address], [place] [address]  Please note: We ask at that you not bring children with you during ultrasound (echo/ vascular) testing. Due to room size and safety concerns, children are not allowed in the ultrasound rooms during exams. Our front office staff cannot provide observation of children in our lobby area while testing is being conducted. An adult accompanying a patient to their appointment will only be allowed in the ultrasound room at the discretion of the ultrasound technician under special circumstances. We apologize for any inconvenience.   Follow-Up: At [facility], you and your health needs are our priority.  As part of our continuing mission to provide you with exceptional heart care, our providers are all part of one team.  This team  includes your primary Cardiologist (physician) and Advanced Practice Providers or APPs (Physician Assistants and Nurse Practitioners) who all work together to provide you with the care you need, when you need it.  Your next appointment:   4 month(s)  Provider:   You may see Constancia Delton, MD or one of the following Advanced Practice Providers on your designated Care Team:   Laneta Pintos, NP Gildardo Labrador, PA-C Varney Gentleman, PA-C Cadence McCool, PA-C Ronald Cockayne, NP Morey Ar, NP    We recommend signing up for the patient portal called "MyChart".  Sign up information is provided on this After Visit Summary.  MyChart is used to connect with patients for Virtual Visits (Telemedicine).  Patients are able to view lab/test results, encounter notes, upcoming appointments, etc.  Non-urgent messages can be sent to your provider as well.   To learn more about what you can do with MyChart, go to [email or web address].
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
## 2023-10-09 ENCOUNTER — Telehealth: Payer: Self-pay | Admitting: Cardiology

## 2023-10-09 NOTE — Telephone Encounter (Signed)
 Pt c/o swelling/edema: STAT if pt has developed SOB within 24 hours  If swelling, where is the swelling located? Bi lat LE edema  How much weight have you gained and in what time span? unchanged  Have you gained 2 pounds in a day or 5 pounds in a week? No  Do you have a log of your daily weights (if so, list)? No  Are you currently taking a fluid pill? yes  Are you currently SOB? No  Have you traveled recently in a car or plane for an extended period of time? No

## 2023-10-09 NOTE — Telephone Encounter (Signed)
 Called and spoke with the patient. Patient states not taking any fluid pills.  States he was taken off Spironolactone  by his PCP, last dose being around the beginning of May.  Then he had an appointment with Dr. Junnie Olives where his Entresto  was increased from half tablet twice daily to a full tablet twice daily.  Patient stated that the only other medicine change is that he is now taking Dicyclomine 10 mg 3 times daily PRN for IBS.  Patient states he had swelling in his ankles and feet yesterday that was resolved with elevating his feet.  He notices the swelling coming back today to the point where the feet are about twice their regular size.  Patient is denying any shortness of breath, lightheadedness, chest pain/pressure. Patient states that he now has a new PCP and only trusts Dr. Junnie Olives.  Patient advised that this message will be forwarded to Dr. Junnie Olives and his primary nurse, Summer.

## 2023-10-12 ENCOUNTER — Encounter: Payer: Self-pay | Admitting: Cardiology

## 2023-10-12 DIAGNOSIS — Z79899 Other long term (current) drug therapy: Secondary | ICD-10-CM

## 2023-10-12 LAB — COLOGUARD: COLOGUARD: POSITIVE — AB

## 2023-10-13 MED ORDER — FUROSEMIDE 20 MG PO TABS: 20.0000 mg | ORAL_TABLET | Freq: Every day | ORAL | 3 refills | Status: DC

## 2023-10-13 NOTE — Addendum Note (Signed)
 Addended by: BRIEN SALM on: 10/13/2023 08:56 AM   Modules accepted: Orders

## 2023-10-14 ENCOUNTER — Telehealth: Payer: Self-pay

## 2023-10-14 NOTE — Telephone Encounter (Signed)
 Patient has requested to schedule his colonoscopy in Sept/Oct due to other appt that he and his wife have between now and Sept.  Informed him that I will call him back in Sept to schedule and will send cardiac clearance prior to scheduling.  He has cardiology appt in Sept.  Thanks,  [place], CMA

## 2023-10-15 NOTE — Telephone Encounter (Signed)
 Patient already spoke to [place].

## 2023-10-27 ENCOUNTER — Other Ambulatory Visit: Payer: Self-pay

## 2023-10-27 DIAGNOSIS — Z79899 Other long term (current) drug therapy: Secondary | ICD-10-CM

## 2023-10-28 LAB — BASIC METABOLIC PANEL WITH GFR
BUN/Creatinine Ratio: 11 (ref 10–24)
BUN: 12 mg/dL (ref 8–27)
CO2: 18 mmol/L — ABNORMAL LOW (ref 20–29)
Calcium: 8.5 mg/dL — ABNORMAL LOW (ref 8.6–10.2)
Chloride: 101 mmol/L (ref 96–106)
Creatinine, Ser: 1.11 mg/dL (ref 0.76–1.27)
Glucose: 133 mg/dL — ABNORMAL HIGH (ref 70–99)
Potassium: 4.1 mmol/L (ref 3.5–5.2)
Sodium: 139 mmol/L (ref 134–144)
eGFR: 72 mL/min/1.73 (ref >59)

## 2023-10-30 ENCOUNTER — Other Ambulatory Visit (INDEPENDENT_AMBULATORY_CARE_PROVIDER_SITE_OTHER): Payer: Self-pay | Admitting: Vascular Surgery

## 2023-10-30 DIAGNOSIS — I7143 Infrarenal abdominal aortic aneurysm, without rupture: Secondary | ICD-10-CM

## 2023-10-30 DIAGNOSIS — I70213 Atherosclerosis of native arteries of extremities with intermittent claudication, bilateral legs: Secondary | ICD-10-CM

## 2023-11-02 ENCOUNTER — Ambulatory Visit (INDEPENDENT_AMBULATORY_CARE_PROVIDER_SITE_OTHER): Payer: Medicare Other

## 2023-11-02 ENCOUNTER — Encounter (INDEPENDENT_AMBULATORY_CARE_PROVIDER_SITE_OTHER): Payer: Self-pay | Admitting: Vascular Surgery

## 2023-11-02 ENCOUNTER — Ambulatory Visit (INDEPENDENT_AMBULATORY_CARE_PROVIDER_SITE_OTHER): Payer: Medicare Other | Admitting: Vascular Surgery

## 2023-11-02 ENCOUNTER — Other Ambulatory Visit (INDEPENDENT_AMBULATORY_CARE_PROVIDER_SITE_OTHER): Payer: Medicare Other

## 2023-11-02 VITALS — BP 123/80 | HR 65 | Ht 72.0 in | Wt 187.4 lb

## 2023-11-02 DIAGNOSIS — I10 Essential (primary) hypertension: Secondary | ICD-10-CM | POA: Diagnosis not present

## 2023-11-02 DIAGNOSIS — I25119 Atherosclerotic heart disease of native coronary artery with unspecified angina pectoris: Secondary | ICD-10-CM

## 2023-11-02 DIAGNOSIS — I70213 Atherosclerosis of native arteries of extremities with intermittent claudication, bilateral legs: Secondary | ICD-10-CM | POA: Diagnosis not present

## 2023-11-02 DIAGNOSIS — E782 Mixed hyperlipidemia: Secondary | ICD-10-CM

## 2023-11-02 DIAGNOSIS — I7143 Infrarenal abdominal aortic aneurysm, without rupture: Secondary | ICD-10-CM | POA: Diagnosis not present

## 2023-11-04 LAB — VAS US ABI WITH/WO TBI
Left ABI: 0.81
Right ABI: 1.03

## 2023-11-07 ENCOUNTER — Encounter (INDEPENDENT_AMBULATORY_CARE_PROVIDER_SITE_OTHER): Payer: Self-pay | Admitting: Vascular Surgery
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
## 2023-11-19 ENCOUNTER — Other Ambulatory Visit: Payer: Self-pay

## 2023-11-19 MED ORDER — CARVEDILOL 12.5 MG PO TABS: 12.5000 mg | ORAL_TABLET | Freq: Two times a day (BID) | ORAL | 3 refills | Status: AC

## 2023-12-13 ENCOUNTER — Emergency Department

## 2023-12-13 ENCOUNTER — Emergency Department
Admission: EM | Admit: 2023-12-13 | Discharge: 2023-12-13 | Disposition: A | Discharge status: Home / Self Care | Attending: Emergency Medicine | Admitting: Emergency Medicine

## 2023-12-13 ENCOUNTER — Other Ambulatory Visit: Payer: Self-pay

## 2023-12-13 DIAGNOSIS — R531 Weakness: Secondary | ICD-10-CM | POA: Diagnosis not present

## 2023-12-13 DIAGNOSIS — R109 Unspecified abdominal pain: Secondary | ICD-10-CM | POA: Diagnosis present

## 2023-12-13 DIAGNOSIS — N189 Chronic kidney disease, unspecified: Secondary | ICD-10-CM | POA: Diagnosis not present

## 2023-12-13 DIAGNOSIS — R202 Paresthesia of skin: Secondary | ICD-10-CM | POA: Diagnosis not present

## 2023-12-13 DIAGNOSIS — D696 Thrombocytopenia, unspecified: Secondary | ICD-10-CM | POA: Diagnosis not present

## 2023-12-13 DIAGNOSIS — R5383 Other fatigue: Secondary | ICD-10-CM | POA: Insufficient documentation

## 2023-12-13 DIAGNOSIS — Z8673 Personal history of transient ischemic attack (TIA), and cerebral infarction without residual deficits: Secondary | ICD-10-CM | POA: Insufficient documentation

## 2023-12-13 DIAGNOSIS — I129 Hypertensive chronic kidney disease with stage 1 through stage 4 chronic kidney disease, or unspecified chronic kidney disease: Secondary | ICD-10-CM | POA: Diagnosis not present

## 2023-12-13 DIAGNOSIS — E876 Hypokalemia: Secondary | ICD-10-CM | POA: Insufficient documentation

## 2023-12-13 LAB — CBC
HCT: 43.8 % (ref 39.0–52.0)
Hemoglobin: 14.9 g/dL (ref 13.0–17.0)
MCH: 30.8 pg (ref 26.0–34.0)
MCHC: 34 g/dL (ref 30.0–36.0)
MCV: 90.7 fL (ref 80.0–100.0)
Platelets: 77 K/uL — ABNORMAL LOW (ref 150–400)
RBC: 4.83 MIL/uL (ref 4.22–5.81)
RDW: 14.6 % (ref 11.5–15.5)
WBC: 6.6 K/uL (ref 4.0–10.5)
nRBC: 0 % (ref 0.0–0.2)

## 2023-12-13 LAB — COMPREHENSIVE METABOLIC PANEL WITH GFR
ALT: 25 U/L (ref 0–44)
AST: 37 U/L (ref 15–41)
Albumin: 3.7 g/dL (ref 3.5–5.0)
Alkaline Phosphatase: 100 U/L (ref 38–126)
Anion gap: 13 (ref 5–15)
BUN: 7 mg/dL — ABNORMAL LOW (ref 8–23)
CO2: 26 mmol/L (ref 22–32)
Calcium: 7.3 mg/dL — ABNORMAL LOW (ref 8.9–10.3)
Chloride: 98 mmol/L (ref 98–111)
Creatinine, Ser: 1.04 mg/dL (ref 0.61–1.24)
GFR, Estimated: >60 mL/min (ref >60)
Glucose, Bld: 201 mg/dL — ABNORMAL HIGH (ref 70–99)
Potassium: 2.7 mmol/L — CL (ref 3.5–5.1)
Sodium: 137 mmol/L (ref 135–145)
Total Bilirubin: 1.3 mg/dL — ABNORMAL HIGH (ref 0.0–1.2)
Total Protein: 6.5 g/dL (ref 6.5–8.1)

## 2023-12-13 LAB — URINALYSIS, ROUTINE W REFLEX MICROSCOPIC
Bacteria, UA: NONE SEEN
Bilirubin Urine: NEGATIVE
Glucose, UA: NEGATIVE mg/dL
Hgb urine dipstick: NEGATIVE
Ketones, ur: NEGATIVE mg/dL
Leukocytes,Ua: NEGATIVE
Nitrite: NEGATIVE
Protein, ur: NEGATIVE mg/dL
Specific Gravity, Urine: 1.016 (ref 1.005–1.030)
Squamous Epithelial / HPF: 0 /HPF (ref 0–5)
pH: 7 (ref 5.0–8.0)

## 2023-12-13 LAB — LIPASE, BLOOD: Lipase: 34 U/L (ref 11–51)

## 2023-12-13 LAB — MAGNESIUM: Magnesium: 0.8 mg/dL — CL (ref 1.7–2.4)

## 2023-12-13 MED ORDER — LACTATED RINGERS IV BOLUS
1000.0000 mL | Freq: Once | INTRAVENOUS | Status: AC
  Administered 2023-12-13: 1000 mL via INTRAVENOUS

## 2023-12-13 MED ORDER — IOHEXOL 350 MG/ML SOLN
100.0000 mL | Freq: Once | INTRAVENOUS | Status: AC | PRN
  Administered 2023-12-13: 100 mL via INTRAVENOUS

## 2023-12-13 MED ORDER — MAGNESIUM SULFATE 2 GM/50ML IV SOLN
2.0000 g | INTRAVENOUS | Status: AC
  Administered 2023-12-13: 2 g via INTRAVENOUS
  Filled 2023-12-13: qty 50

## 2023-12-13 MED ORDER — POTASSIUM CHLORIDE CRYS ER 10 MEQ PO TBCR: 10.0000 meq | EXTENDED_RELEASE_TABLET | Freq: Two times a day (BID) | ORAL | 0 refills | Status: AC

## 2023-12-13 MED ORDER — MAGNESIUM OXIDE (ELEMENTAL) 400 MG PO TABS: 1.0000 | ORAL_TABLET | Freq: Two times a day (BID) | ORAL | 0 refills | Status: AC

## 2023-12-13 MED ORDER — POTASSIUM CHLORIDE 10 MEQ/100ML IV SOLN
10.0000 meq | Freq: Once | INTRAVENOUS | Status: AC
  Administered 2023-12-13: 10 meq via INTRAVENOUS
  Filled 2023-12-13: qty 100

## 2023-12-13 MED ORDER — POTASSIUM CHLORIDE CRYS ER 20 MEQ PO TBCR
40.0000 meq | EXTENDED_RELEASE_TABLET | Freq: Once | ORAL | Status: AC
  Administered 2023-12-13: 40 meq via ORAL
  Filled 2023-12-13: qty 2

## 2023-12-13 NOTE — ED Notes (Signed)
 Pt stated he did not have to urinate at the moment but would try for a sample as soon as he felt like he could.

## 2023-12-13 NOTE — ED Triage Notes (Signed)
 Pt presents to the ED via POV from home with abdominal pain. Pt reports having an AAA repair last year. Pt reports numbness and tingling in bilateral hands. Pt reports intermittent chest pain and SHOB.  Secure hollace Sharps, MD regarding patient.

## 2023-12-13 NOTE — ED Notes (Signed)
 MD Viviann informed of  0.8 mag result

## 2023-12-13 NOTE — ED Notes (Addendum)
 MD Claudene informed of K+ of 2.7, acuity level changed

## 2023-12-13 NOTE — Discharge Instructions (Addendum)
 Please take your supplements as prescribed.  Please follow-up with your doctor this week for recheck of your lab work including magnesium  and potassium.  Return to the emergency department for any symptoms personally concerning to yourself.

## 2023-12-13 NOTE — ED Notes (Signed)
 This RN attempted for an IV as well as another Charity fundraiser. All times were unsuccessful. IV team order placed.

## 2023-12-13 NOTE — ED Provider Notes (Signed)
 [facility] Provider Note    Event Date/Time   First MD Initiated Contact with Patient 12/13/23 1546     (approximate)  History   Chief Complaint: Abdominal Pain  HPI  Michael Bonilla is a 68 y.o. male with a past medical history of AAA repair last year, alcohol abuse, anxiety, CKD, gastric reflux, hypertension, hyperlipidemia, prior CVA, presents to the emergency department with several days of left-sided abdominal pain as well as paresthesias.  According to the patient over the last 3 to 4 days he has been feeling generalized fatigue weakness experiencing paresthesias in both of his arms and at times in his left leg.  Over the same timeframe he has been experiencing a sharp type pain to his left mid abdomen.  Patient is concerned as he had a AAA repair by Dr. Jama last year.  Physical Exam   Triage Vital Signs: ED Triage Vitals  Encounter Vitals Group     BP 12/13/23 1337 124/73     Girls Systolic BP Percentile --      Girls Diastolic BP Percentile --      Boys Systolic BP Percentile --      Boys Diastolic BP Percentile --      Pulse Rate 12/13/23 1337 79     Resp 12/13/23 1337 18     Temp 12/13/23 1337 98.3 F (36.8 C)     Temp Source 12/13/23 1337 Oral     SpO2 12/13/23 1337 100 %     Weight 12/13/23 1338 185 lb (83.9 kg)     Height 12/13/23 1338 6' (1.829 m)     Head Circumference --      Peak Flow --      Pain Score 12/13/23 1338 7     Pain Loc --      Pain Education --      Exclude from Growth Chart --     Most recent vital signs: Vitals:   12/13/23 1337  BP: 124/73  Pulse: 79  Resp: 18  Temp: 98.3 F (36.8 C)  SpO2: 100%    General: Awake, no distress.  CV:  Good peripheral perfusion.  Regular rate and rhythm  Resp:  Normal effort.  Equal breath sounds bilaterally.  Abd:  No distention.  Soft, mild left mid abdominal tenderness to palpation.   ED Results / Procedures / Treatments   MEDICATIONS ORDERED IN  ED: Medications  magnesium  sulfate IVPB 2 g 50 mL (has no administration in time range)  potassium chloride  10 mEq in 100 mL IVPB (has no administration in time range)  potassium chloride  SA (KLOR-CON  M) CR tablet 40 mEq (has no administration in time range)  lactated ringers  bolus 1,000 mL (has no administration in time range)   I have reviewed and interpreted the CTA images I do not appreciate any obvious aortic aneurysm leak or occlusion. Radiology has read chronic findings but no acute change.  IMPRESSION / MDM / ASSESSMENT AND PLAN / ED COURSE  I reviewed the triage vital signs and the nursing notes.  Patient's presentation is most consistent with acute presentation with potential threat to life or bodily function.  Patient presents emergency department for generalized weakness fatigue abdominal pain and paresthesias.  According to the patient for the past 3 to 4 days he has been experiencing paresthesias in bilateral extremities as well as left leg experiencing generalized fatigue weakness and has been experiencing left-sided abdominal pain.  Given the patient's AAA repair last year  with a sharp pain in the left abdomen we will obtain CT imaging of his aorta to evaluate for any AAA leak or abnormality.  This will also give us  a good view of the abdomen to evaluate for other intra-abdominal pathology.  Patient's lab work today shows a reassuring CBC with what appears to be chronic thrombocytopenia.  Patient's chemistry shows significant hypokalemia with a potassium of 2.7 as well as hypomagnesemia with a magnesium  of 0.8.  This could most definitely explain the patient's weakness and paresthesias.  Will dose IV magnesium  as well as IV and oral potassium.  Patient will likely require admission to the hospital given significant magnesium  deficit and symptomatic.  We will reassess after IV and oral repletion and CTA results.  CTA is negative.  However given the patient's generalized weakness  paresthesias and hypomagnesemia hypokalemia we will admit to the hospital service for repletion.  Patient is adamant that he wants to go home.  He understands the findings and risk but wishes to go home.  Will place patient on magnesium  and potassium supplements discussed with the patient to follow-up with his doctor this week for recheck of his labs.  Discussed return precautions.  Patient agreeable to plan.  FINAL CLINICAL IMPRESSION(S) / ED DIAGNOSES   Abdominal pain Hypomagnesemia Hypokalemia Weakness Paresthesias  Note:  This document was prepared using Dragon voice recognition software and may include unintentional dictation errors.   Dorothyann Drivers, MD 12/13/23 [phone]

## 2023-12-29 ENCOUNTER — Ambulatory Visit: Attending: Cardiology

## 2023-12-29 DIAGNOSIS — I429 Cardiomyopathy, unspecified: Secondary | ICD-10-CM

## 2023-12-29 LAB — ECHOCARDIOGRAM COMPLETE
AR max vel: 3.69 cm2
AV Area VTI: 3.74 cm2
AV Area mean vel: 3.71 cm2
AV Mean grad: 3 mmHg
AV Peak grad: 6.3 mmHg
Ao pk vel: 1.25 m/s
Area-P 1/2: 2.6 cm2
Calc EF: 28.6 %
MV M vel: 5.24 m/s
MV Peak grad: 109.8 mmHg
MV VTI: 3.7 cm2
P 1/2 time: 469 ms
Radius: 0.7 cm
S' Lateral: 5.8 cm
Single Plane A2C EF: 30.8 %
Single Plane A4C EF: 25 %

## 2023-12-31 ENCOUNTER — Ambulatory Visit: Payer: Self-pay | Admitting: Cardiology

## 2024-01-15 ENCOUNTER — Encounter: Payer: Self-pay | Admitting: Cardiology

## 2024-01-15 ENCOUNTER — Ambulatory Visit: Attending: Cardiology | Admitting: Cardiology

## 2024-01-15 VITALS — BP 116/56 | HR 63 | Ht 72.0 in | Wt 177.0 lb

## 2024-01-15 DIAGNOSIS — I429 Cardiomyopathy, unspecified: Secondary | ICD-10-CM

## 2024-01-15 DIAGNOSIS — I25119 Atherosclerotic heart disease of native coronary artery with unspecified angina pectoris: Secondary | ICD-10-CM | POA: Diagnosis not present

## 2024-01-15 DIAGNOSIS — I5020 Unspecified systolic (congestive) heart failure: Secondary | ICD-10-CM | POA: Diagnosis not present

## 2024-01-15 DIAGNOSIS — I10 Essential (primary) hypertension: Secondary | ICD-10-CM

## 2024-01-15 MED ORDER — FUROSEMIDE 20 MG PO TABS: 20.0000 mg | ORAL_TABLET | ORAL | 3 refills | Status: AC | PRN
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
## 2024-01-15 NOTE — Patient Instructions (Signed)
 Medication Instructions:  - CHANGE lasix  to as needed  *If you need a refill on your cardiac medications before your next appointment, please call your pharmacy*  Lab Work: Your provider would like for you to have following labs drawn today BMP, MAGNESIUM .   If you have labs (blood work) drawn today and your tests are completely normal, you will receive your results only by: MyChart Message (if you have MyChart) OR A paper copy in the mail If you have any lab test that is abnormal or we need to change your treatment, we will call you to review the results.  Testing/Procedures: No test ordered today   Follow-Up: At [facility], you and your health needs are our priority.  As part of our continuing mission to provide you with exceptional heart care, our providers are all part of one team.  This team includes your primary Cardiologist (physician) and Advanced Practice Providers or APPs (Physician Assistants and Nurse Practitioners) who all work together to provide you with the care you need, when you need it.  Your next appointment:   6 month(s)  Provider:   You may see Redell Cave, MD or one of the following Advanced Practice Providers on your designated Care Team:   Lonni Meager, NP Lesley Maffucci, PA-C Bernardino Bring, PA-C Cadence [place], PA-C Tylene Lunch, NP Barnie Hila, NP    We recommend signing up for the patient portal called MyChart.  Sign up information is provided on this After Visit Summary.  MyChart is used to connect with patients for Virtual Visits (Telemedicine).  Patients are able to view lab/test results, encounter notes, upcoming appointments, etc.  Non-urgent messages can be sent to your provider as well.   To learn more about what you can do with MyChart, go to [email or web address].

## 2024-01-16 LAB — BASIC METABOLIC PANEL WITH GFR
BUN/Creatinine Ratio: 10 (ref 10–24)
BUN: 10 mg/dL (ref 8–27)
CO2: 19 mmol/L — ABNORMAL LOW (ref 20–29)
Calcium: 9.3 mg/dL (ref 8.6–10.2)
Chloride: 102 mmol/L (ref 96–106)
Creatinine, Ser: 0.97 mg/dL (ref 0.76–1.27)
Glucose: 136 mg/dL — ABNORMAL HIGH (ref 70–99)
Potassium: 4.4 mmol/L (ref 3.5–5.2)
Sodium: 138 mmol/L (ref 134–144)
eGFR: 85 mL/min/1.73 (ref >59)

## 2024-01-16 LAB — MAGNESIUM: Magnesium: 1.3 mg/dL — ABNORMAL LOW (ref 1.6–2.3)

## 2024-01-17 ENCOUNTER — Ambulatory Visit: Payer: Self-pay | Admitting: Cardiology

## 2024-01-31 ENCOUNTER — Other Ambulatory Visit: Payer: Self-pay | Admitting: Cardiology

## 2024-02-11 ENCOUNTER — Telehealth: Payer: Self-pay | Admitting: Internal Medicine

## 2024-02-11 NOTE — Telephone Encounter (Signed)
 Called to r/s appt on 11/7

## 2024-02-18 ENCOUNTER — Telehealth: Payer: Self-pay | Admitting: Cardiology

## 2024-02-18 ENCOUNTER — Telehealth: Payer: Self-pay

## 2024-02-18 NOTE — Telephone Encounter (Signed)
 Clearance faxed to cardiology Redell Cave, MD

## 2024-02-18 NOTE — Telephone Encounter (Signed)
 Dr. Darliss, you recently saw this pt in clinic. Are you able to comment on surgical clearance for upcoming colonscopy scheduled for TBD? Please route your response to P CV DIV PREOP. Thank you!

## 2024-02-18 NOTE — Telephone Encounter (Signed)
   Pre-operative Risk Assessment    Patient Name: Michael Bonilla  DOB: 11/07/1955 MRN: [id number]   Date of last office visit: 01/15/24 Date of next office visit: n/a   Request for Surgical Clearance    Procedure:  colonoscopy  Date of Surgery:  Clearance TBD                                Surgeon:  n/a Surgeon's Group or Practice Name:  [facility] Phone number:  [phone] Fax number:  [phone]   Type of Clearance Requested:   - Medical    Type of Anesthesia:  General    Additional requests/questions:    SignedRosina Stamps   02/18/2024, 3:52 PM

## 2024-02-19 NOTE — Telephone Encounter (Signed)
   Primary Cardiologist: Redell Cave, MD  Chart reviewed as part of pre-operative protocol coverage. Given past medical history and time since last visit, based on ACC/AHA guidelines, Michael Bonilla would be at acceptable risk for the planned procedure without further cardiovascular testing.   Patient should contact our office if he is having new symptoms that are concerning from a cardiac perspective to arrange a follow-up appointment.    Ideally aspirin  should be continued without interruption, however if the bleeding risk is too great, aspirin  may be held for 5-7 days prior to surgery. Please resume aspirin  post operatively when it is felt to be safe from a bleeding standpoint.   I will route this recommendation to the requesting party via Epic fax function and remove from pre-op pool.  Please call with questions.  Michael EMERSON Bane, NP-C  02/19/2024, 1:29 PM [address], [address] [place], [place] [address] Office [phone] Fax [phone]

## 2024-02-24 ENCOUNTER — Telehealth: Payer: Self-pay

## 2024-02-24 ENCOUNTER — Other Ambulatory Visit: Payer: Self-pay

## 2024-02-24 DIAGNOSIS — R195 Other fecal abnormalities: Secondary | ICD-10-CM

## 2024-02-24 DIAGNOSIS — Z1211 Encounter for screening for malignant neoplasm of colon: Secondary | ICD-10-CM

## 2024-02-24 MED ORDER — NA SULFATE-K SULFATE-MG SULF 17.5-3.13-1.6 GM/177ML PO SOLN: 1.0000 | Freq: Once | ORAL | 0 refills | Status: AC

## 2024-02-24 NOTE — Telephone Encounter (Signed)
 Gastroenterology Pre-Procedure Review  Request Date: 05/03/24 Requesting Physician: Dr. Melany  PATIENT REVIEW QUESTIONS: The patient responded to the following health history questions as indicated:    1. Are you having any GI issues? Positive cologuard, however no GI issues 2. Do you have a personal history of Polyps? no 3. Do you have a family history of Colon Cancer or Polyps? no 4. Diabetes Mellitus? no 5. Joint replacements in the past 12 months?no 6. Major health problems in the past 3 months?no 7. Any artificial heart valves, MVP, or defibrillator?no 8. Cardiac history? Yes Cardiology Appt scheduled 03/28/24 Clearance faxed prior to scheduling    MEDICATIONS & ALLERGIES:    Patient reports the following regarding taking any anticoagulation/antiplatelet therapy:   Plavix , Coumadin, Eliquis, Xarelto, Lovenox, Pradaxa, Brilinta, or Effient? no Aspirin ? yes (81 mg daily)  Patient confirms/reports the following medications:  Current Outpatient Medications  Medication Sig Dispense Refill   aspirin  81 MG tablet Take 81 mg by mouth daily.     atorvastatin  (LIPITOR) 40 MG tablet TAKE 1 TABLET(40 MG) BY MOUTH DAILY 90 tablet 3   busPIRone  (BUSPAR ) 10 MG tablet Take 10 mg by mouth 3 (three) times daily. PRN     carvedilol  (COREG ) 12.5 MG tablet Take 1 tablet (12.5 mg total) by mouth 2 (two) times daily. 180 tablet 3   diazepam (VALIUM) 5 MG tablet Take 5 mg by mouth every 6 (six) hours as needed. (Patient not taking: Reported on 01/15/2024)     diphenhydramine -acetaminophen  (TYLENOL  PM EXTRA STRENGTH) 25-500 MG TABS tablet Take 1 tablet by mouth at bedtime as needed. (Patient not taking: Reported on 01/15/2024)     furosemide  (LASIX ) 20 MG tablet Take 1 tablet (20 mg total) by mouth as needed. 30 tablet 3   gabapentin  (NEURONTIN ) 300 MG capsule TAKE 1 CAPSULE BY MOUTH THREE TIMES A DAY (Patient taking differently: Take 300 mg by mouth 3 (three) times daily.) 270 capsule 0   ibuprofen  (ADVIL) 600 MG tablet Take 600 mg by mouth every 6 (six) hours as needed. (Patient not taking: Reported on 01/15/2024)     Magnesium  Oxide, Elemental, 400 MG TABS Take 1 tablet by mouth 2 (two) times daily. (Patient not taking: Reported on 01/15/2024) 14 tablet 0   methocarbamol  (ROBAXIN ) 500 MG tablet Take 1 tablet (500 mg total) by mouth every 8 (eight) hours as needed for muscle spasms. 60 tablet 0   nitroGLYCERIN  (NITROSTAT ) 0.6 MG SL tablet Place 1 tablet (0.6 mg total) under the tongue every 5 (five) minutes as needed for chest pain. 30 tablet 1   Omega-3 Fatty Acids (FISH OIL) 1000 MG CAPS Take 1,000 mg by mouth daily.     omeprazole  (PRILOSEC) 40 MG capsule TAKE 1 CAPSULE (40 MG TOTAL) BY MOUTH DAILY. 90 capsule 1   potassium chloride  (KLOR-CON  M) 10 MEQ tablet Take 1 tablet (10 mEq total) by mouth 2 (two) times daily. 14 tablet 0   sacubitril -valsartan  (ENTRESTO ) 49-51 MG Take 1 tablet by mouth 2 (two) times daily. 60 tablet 11   No current facility-administered medications for this visit.    Patient confirms/reports the following allergies:  No Known Allergies  No orders of the defined types were placed in this encounter.   AUTHORIZATION INFORMATION Primary Insurance: 1D#: Group #:  Secondary Insurance: 1D#: Group #:  SCHEDULE INFORMATION: Date: 05/03/24 Time: Location: [facility]

## 2024-02-24 NOTE — Telephone Encounter (Signed)
 New Encounter opened in error.  Rosaline, CMA

## 2024-02-26 ENCOUNTER — Encounter: Admitting: Internal Medicine

## 2024-03-03 NOTE — Telephone Encounter (Signed)
 Primary Cardiologist: Redell Cave, MD   Chart reviewed as part of pre-operative protocol coverage. Given past medical history and time since last visit, based on ACC/AHA guidelines, Michael Bonilla would be at acceptable risk for the planned procedure without further cardiovascular testing.    Patient should contact our office if he is having new symptoms that are concerning from a cardiac perspective to arrange a follow-up appointment.     Ideally aspirin  should be continued without interruption, however if the bleeding risk is too great, aspirin  may be held for 5-7 days prior to surgery. Please resume aspirin  post operatively when it is felt to be safe from a bleeding standpoint.    I will route this recommendation to the requesting party via Epic fax function and remove from pre-op pool.   Please call with questions.   Rosaline EMERSON Bane, NP-C

## 2024-03-03 NOTE — Telephone Encounter (Signed)
 Primary Cardiologist: Redell Cave, MD   Chart reviewed as part of pre-operative protocol coverage. Given past medical history and time since last visit, based on ACC/AHA guidelines, IBRAHEM VOLKMAN would be at acceptable risk for the planned procedure without further cardiovascular testing.    Patient should contact our office if he is having new symptoms that are concerning from a cardiac perspective to arrange a follow-up appointment.     Ideally aspirin  should be continued without interruption, however if the bleeding risk is too great, aspirin  may be held for 5-7 days prior to surgery. Please resume aspirin  post operatively when it is felt to be safe from a bleeding standpoint.    I will route this recommendation to the requesting party via Epic fax function and remove from pre-op pool.   Please call with questions.   Rosaline EMERSON Bane, NP-C

## 2024-03-25 ENCOUNTER — Telehealth: Payer: Self-pay | Admitting: Cardiology

## 2024-03-25 NOTE — Telephone Encounter (Signed)
 Called to confirm/remind patient of their appointment at [facility] on 03/28/24.   Appointment:   [] Confirmed  [x] Left mess   [] No answer/No voice mail  [] VM Full/unable to leave message  [] Phone not in service  Patient reminded to bring all medications and/or complete list.  Confirmed patient has transportation. Gave directions, instructed to utilize valet parking.

## 2024-03-28 ENCOUNTER — Ambulatory Visit: Admitting: Cardiology

## 2024-04-20 ENCOUNTER — Other Ambulatory Visit (INDEPENDENT_AMBULATORY_CARE_PROVIDER_SITE_OTHER): Payer: Self-pay | Admitting: Vascular Surgery

## 2024-04-20 DIAGNOSIS — I7143 Infrarenal abdominal aortic aneurysm, without rupture: Secondary | ICD-10-CM

## 2024-04-25 ENCOUNTER — Telehealth (INDEPENDENT_AMBULATORY_CARE_PROVIDER_SITE_OTHER): Payer: Self-pay

## 2024-04-25 NOTE — Telephone Encounter (Signed)
 Patient called at this time in reference to confusion about his appointment for Monday 05/02/24, called back at this time and explained he has two ultrasounds and an appointment with Dr. Jama. Patient was confused as to why he was having an EVAR following his ABI and I advised patient to discuss the need with ultrasound at time of exam.

## 2024-04-26 ENCOUNTER — Encounter: Payer: Self-pay | Admitting: Gastroenterology
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
## 2024-05-02 ENCOUNTER — Encounter (INDEPENDENT_AMBULATORY_CARE_PROVIDER_SITE_OTHER): Payer: Self-pay | Admitting: Vascular Surgery

## 2024-05-02 ENCOUNTER — Other Ambulatory Visit (INDEPENDENT_AMBULATORY_CARE_PROVIDER_SITE_OTHER)

## 2024-05-02 ENCOUNTER — Ambulatory Visit (INDEPENDENT_AMBULATORY_CARE_PROVIDER_SITE_OTHER): Admitting: Vascular Surgery

## 2024-05-02 ENCOUNTER — Ambulatory Visit (INDEPENDENT_AMBULATORY_CARE_PROVIDER_SITE_OTHER)

## 2024-05-02 VITALS — BP 137/74 | HR 55 | Resp 17 | Ht 72.0 in | Wt 183.0 lb

## 2024-05-02 DIAGNOSIS — I70213 Atherosclerosis of native arteries of extremities with intermittent claudication, bilateral legs: Secondary | ICD-10-CM | POA: Diagnosis not present

## 2024-05-02 DIAGNOSIS — I7143 Infrarenal abdominal aortic aneurysm, without rupture: Secondary | ICD-10-CM

## 2024-05-02 DIAGNOSIS — I10 Essential (primary) hypertension: Secondary | ICD-10-CM

## 2024-05-02 DIAGNOSIS — I25119 Atherosclerotic heart disease of native coronary artery with unspecified angina pectoris: Secondary | ICD-10-CM

## 2024-05-02 DIAGNOSIS — E782 Mixed hyperlipidemia: Secondary | ICD-10-CM | POA: Diagnosis not present

## 2024-05-03 ENCOUNTER — Encounter: Payer: Self-pay | Admitting: Gastroenterology

## 2024-05-03 ENCOUNTER — Ambulatory Visit: Admitting: Anesthesiology

## 2024-05-03 ENCOUNTER — Ambulatory Visit
Admission: RE | Admit: 2024-05-03 | Discharge: 2024-05-03 | Disposition: A | Attending: Gastroenterology | Admitting: Gastroenterology

## 2024-05-03 ENCOUNTER — Encounter
Admission: RE | Disposition: A | Discharge status: Home / Self Care | Payer: Self-pay | Source: Home / Self Care | Attending: Gastroenterology

## 2024-05-03 DIAGNOSIS — Z8673 Personal history of transient ischemic attack (TIA), and cerebral infarction without residual deficits: Secondary | ICD-10-CM | POA: Insufficient documentation

## 2024-05-03 DIAGNOSIS — I351 Nonrheumatic aortic (valve) insufficiency: Secondary | ICD-10-CM | POA: Diagnosis not present

## 2024-05-03 DIAGNOSIS — Z8679 Personal history of other diseases of the circulatory system: Secondary | ICD-10-CM | POA: Diagnosis not present

## 2024-05-03 DIAGNOSIS — K635 Polyp of colon: Secondary | ICD-10-CM

## 2024-05-03 DIAGNOSIS — I739 Peripheral vascular disease, unspecified: Secondary | ICD-10-CM | POA: Diagnosis not present

## 2024-05-03 DIAGNOSIS — I130 Hypertensive heart and chronic kidney disease with heart failure and stage 1 through stage 4 chronic kidney disease, or unspecified chronic kidney disease: Secondary | ICD-10-CM | POA: Insufficient documentation

## 2024-05-03 DIAGNOSIS — K219 Gastro-esophageal reflux disease without esophagitis: Secondary | ICD-10-CM | POA: Insufficient documentation

## 2024-05-03 DIAGNOSIS — F419 Anxiety disorder, unspecified: Secondary | ICD-10-CM | POA: Diagnosis not present

## 2024-05-03 DIAGNOSIS — Z85038 Personal history of other malignant neoplasm of large intestine: Secondary | ICD-10-CM

## 2024-05-03 DIAGNOSIS — R195 Other fecal abnormalities: Secondary | ICD-10-CM | POA: Diagnosis not present

## 2024-05-03 DIAGNOSIS — I252 Old myocardial infarction: Secondary | ICD-10-CM | POA: Diagnosis not present

## 2024-05-03 DIAGNOSIS — K7460 Unspecified cirrhosis of liver: Secondary | ICD-10-CM | POA: Diagnosis not present

## 2024-05-03 DIAGNOSIS — Z955 Presence of coronary angioplasty implant and graft: Secondary | ICD-10-CM | POA: Insufficient documentation

## 2024-05-03 DIAGNOSIS — D124 Benign neoplasm of descending colon: Secondary | ICD-10-CM | POA: Insufficient documentation

## 2024-05-03 DIAGNOSIS — K5730 Diverticulosis of large intestine without perforation or abscess without bleeding: Secondary | ICD-10-CM | POA: Diagnosis not present

## 2024-05-03 DIAGNOSIS — J449 Chronic obstructive pulmonary disease, unspecified: Secondary | ICD-10-CM | POA: Insufficient documentation

## 2024-05-03 DIAGNOSIS — G709 Myoneural disorder, unspecified: Secondary | ICD-10-CM | POA: Diagnosis not present

## 2024-05-03 DIAGNOSIS — Z980 Intestinal bypass and anastomosis status: Secondary | ICD-10-CM | POA: Diagnosis not present

## 2024-05-03 DIAGNOSIS — I5022 Chronic systolic (congestive) heart failure: Secondary | ICD-10-CM | POA: Diagnosis not present

## 2024-05-03 DIAGNOSIS — Z860100 Personal history of colon polyps, unspecified: Secondary | ICD-10-CM | POA: Diagnosis not present

## 2024-05-03 DIAGNOSIS — D123 Benign neoplasm of transverse colon: Secondary | ICD-10-CM | POA: Insufficient documentation

## 2024-05-03 DIAGNOSIS — Z1211 Encounter for screening for malignant neoplasm of colon: Secondary | ICD-10-CM | POA: Diagnosis not present

## 2024-05-03 DIAGNOSIS — N181 Chronic kidney disease, stage 1: Secondary | ICD-10-CM | POA: Diagnosis not present

## 2024-05-03 DIAGNOSIS — F17210 Nicotine dependence, cigarettes, uncomplicated: Secondary | ICD-10-CM | POA: Insufficient documentation

## 2024-05-03 DIAGNOSIS — K621 Rectal polyp: Secondary | ICD-10-CM | POA: Diagnosis not present

## 2024-05-03 DIAGNOSIS — K640 First degree hemorrhoids: Secondary | ICD-10-CM | POA: Diagnosis not present

## 2024-05-03 DIAGNOSIS — I2510 Atherosclerotic heart disease of native coronary artery without angina pectoris: Secondary | ICD-10-CM | POA: Diagnosis not present

## 2024-05-03 HISTORY — DX: Emphysema, unspecified: J43.9

## 2024-05-03 HISTORY — PX: POLYPECTOMY: SHX149

## 2024-05-03 HISTORY — PX: COLONOSCOPY: SHX5424

## 2024-05-03 HISTORY — DX: Unspecified cirrhosis of liver: K74.60

## 2024-05-03 LAB — PROTIME-INR
INR: 1.1 (ref 0.8–1.2)
Prothrombin Time: 14.5 s (ref 11.4–15.2)

## 2024-05-03 SURGERY — COLONOSCOPY
Anesthesia: General

## 2024-05-03 MED ORDER — PROPOFOL 500 MG/50ML IV EMUL
INTRAVENOUS | Status: DC | PRN
  Administered 2024-05-03: 125 ug/kg/min via INTRAVENOUS

## 2024-05-03 MED ORDER — LIDOCAINE HCL (CARDIAC) PF 100 MG/5ML IV SOSY
PREFILLED_SYRINGE | INTRAVENOUS | Status: DC | PRN
  Administered 2024-05-03: 50 mg via INTRAVENOUS

## 2024-05-03 MED ORDER — MIDAZOLAM HCL (PF) 2 MG/2ML IJ SOLN
INTRAMUSCULAR | Status: DC | PRN
  Administered 2024-05-03 (×2): 1 mg via INTRAVENOUS

## 2024-05-03 MED ORDER — SODIUM CHLORIDE 0.9 % IV SOLN
INTRAVENOUS | Status: DC
  Administered 2024-05-03: 500 mL via INTRAVENOUS

## 2024-05-03 MED ORDER — MIDAZOLAM HCL 2 MG/2ML IJ SOLN
INTRAMUSCULAR | Status: AC
  Filled 2024-05-03: qty 2

## 2024-05-03 MED ORDER — PROPOFOL 10 MG/ML IV BOLUS
INTRAVENOUS | Status: DC | PRN
  Administered 2024-05-03: 40 mg via INTRAVENOUS

## 2024-05-03 NOTE — Op Note (Signed)
 [facility] Patient Name: Michael Bonilla Procedure Date: 05/03/2024 9:27 AM MRN: [id number] Account #: [id number] Date of Birth: [birth year] Admit Type: Outpatient Age: 69 Room: [facility] ENDO ROOM 3 Gender: Male Note Status: Finalized Instrument Name: Colon Scope [id number] Procedure:             Colonoscopy Indications:           High risk colon cancer surveillance: Personal history                         of colonic polyps, High risk colon cancer                         surveillance: Personal history of colon cancer Providers:             Clotilda Schaffer, MD Referring MD:          No Local Md, MD (Referring MD) Medicines:             Propofol  per Anesthesia Complications:         No immediate complications. Procedure:             Pre-Anesthesia Assessment:                        - Prior to the procedure, a History and Physical was                         performed, and patient medications and allergies were                         reviewed. The patient's tolerance of previous                         anesthesia was also reviewed. The risks and benefits                         of the procedure and the sedation options and risks                         were discussed with the patient. All questions were                         answered, and informed consent was obtained. Prior                         Anticoagulants: The patient has taken no anticoagulant                         or antiplatelet agents. ASA Grade Assessment: III - A                         patient with severe systemic disease. After reviewing                         the risks and benefits, the patient was deemed in                         satisfactory condition to undergo the procedure.  After obtaining informed consent, the colonoscope was                         passed under direct vision. Throughout the procedure,                         the patient's blood pressure,  pulse, and oxygen                         saturations were monitored continuously. The                         Colonoscope was introduced through the anus and                         advanced to the 10 cm into the ileum. The colonoscopy                         was performed without difficulty. The patient                         tolerated the procedure well. The quality of the bowel                         preparation was good. The terminal ileum, ileocecal                         valve, appendiceal orifice, and rectum were                         photographed. Findings:      There was evidence of a prior side-to-side ileo-colonic anastomosis in       the transverse colon. This was patent and was characterized by healthy       appearing mucosa. The anastomosis was traversed.      Eight sessile polyps were found in the rectum, sigmoid colon, descending       colon and transverse colon. The polyps were 4 to 6 mm in size. These       polyps were removed with a cold snare. Resection and retrieval were       complete. Estimated blood loss: none.      Many medium-mouthed diverticula were found in the entire colon.      Internal hemorrhoids were found. The hemorrhoids were Grade I (internal       hemorrhoids that do not prolapse).      The neo-terminal ileum appeared normal. Impression:            - Patent side-to-side ileo-colonic anastomosis,                         characterized by healthy appearing mucosa.                        - Eight 4 to 6 mm polyps in the rectum, in the sigmoid                         colon, in the descending colon and in the transverse  colon, removed with a cold snare. Resected and                         retrieved.                        - Diverticulosis in the entire examined colon.                        - Internal hemorrhoids.                        - The examined portion of the ileum was normal. Recommendation:        - Patient has a  contact number available for                         emergencies. The signs and symptoms of potential                         delayed complications were discussed with the patient.                         Return to normal activities tomorrow. Written                         discharge instructions were provided to the patient.                        - High fiber diet.                        - Continue present medications.                        - Await pathology results.                        - Repeat colonoscopy in 3 - 5 years for surveillance                         based on pathology results.                        - Personal histories of colon cancer and adenomatous                         polyps BOTH disqualify patient for Cologuard in the                         future.                        - The findings and recommendations were discussed with                         the designated responsible adult. Procedure Code(s):     --- Professional ---                        [phone], Colonoscopy, flexible; with removal of  tumor(s), polyp(s), or other lesion(s) by snare                         technique Diagnosis Code(s):     --- Professional ---                        Z86.010, Personal history of colonic polyps                        Z85.038, Personal history of other malignant neoplasm                         of large intestine                        Z98.0, Intestinal bypass and anastomosis status                        D12.8, Benign neoplasm of rectum                        D12.5, Benign neoplasm of sigmoid colon                        D12.4, Benign neoplasm of descending colon                        D12.3, Benign neoplasm of transverse colon (hepatic                         flexure or splenic flexure)                        K64.0, First degree hemorrhoids                        K57.30, Diverticulosis of large intestine without                          perforation or abscess without bleeding CPT copyright 2022 American [employer]. All rights reserved. The codes documented in this report are preliminary and upon coder review may  be revised to meet current compliance requirements. Clotilda Schaffer, MD 05/03/2024 10:05:55 AM Number of Addenda: 0 Note Initiated On: 05/03/2024 9:27 AM Scope Withdrawal Time: 0 hours 12 minutes 41 seconds  Total Procedure Duration: 0 hours 14 minutes 30 seconds  Estimated Blood Loss:  Estimated blood loss: none.      [facility]
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
## 2024-05-03 NOTE — Transfer of Care (Signed)
 Immediate Anesthesia Transfer of Care Note  Patient: Michael Bonilla [facility]  Procedure(s) Performed: COLONOSCOPY POLYPECTOMY, INTESTINE  Patient Location: PACU and Endoscopy Unit  Anesthesia Type:General  Level of Consciousness: awake and patient cooperative  Airway & Oxygen Therapy: Patient Spontanous Breathing  Post-op Assessment: Report given to RN and Post -op Vital signs reviewed and stable  Post vital signs: Reviewed and stable  Last Vitals:  Vitals Value Taken Time  BP 96/53 05/03/24 10:02  Temp 35.9 C 05/03/24 10:02  Pulse 62 05/03/24 10:02  Resp 16 05/03/24 10:02  SpO2 99 % 05/03/24 10:02    Last Pain:  Vitals:   05/03/24 1002  TempSrc: Tympanic  PainSc: 0-No pain         Complications: No notable events documented.

## 2024-05-03 NOTE — Anesthesia Postprocedure Evaluation (Signed)
"   Anesthesia Post Note  Patient: Michael Bonilla [facility]  Procedure(s) Performed: COLONOSCOPY POLYPECTOMY, INTESTINE  Patient location during evaluation: Endoscopy Anesthesia Type: General Level of consciousness: awake and alert Pain management: pain level controlled Vital Signs Assessment: post-procedure vital signs reviewed and stable Respiratory status: spontaneous breathing, nonlabored ventilation, respiratory function stable and patient connected to nasal cannula oxygen Cardiovascular status: blood pressure returned to baseline and stable Postop Assessment: no apparent nausea or vomiting Anesthetic complications: no   No notable events documented.   Last Vitals:  Vitals:   05/03/24 1012 05/03/24 1021  BP: (!) 124/56 (!) 115/54  Pulse: (!) 56 (!) 52  Resp: 19 16  Temp:    SpO2: 100% 100%    Last Pain:  Vitals:   05/03/24 1021  TempSrc:   PainSc: 0-No pain                 [name]      "

## 2024-05-03 NOTE — Anesthesia Procedure Notes (Signed)
 Procedure Name: MAC Date/Time: 05/03/2024 9:45 AM  Performed by: Lacretia Camelia NOVAK, CRNAPre-anesthesia Checklist: Patient identified, Emergency Drugs available, Suction available and Patient being monitored Patient Re-evaluated:Patient Re-evaluated prior to induction Oxygen Delivery Method: Nasal cannula and Simple face mask
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
## 2024-05-04 LAB — SURGICAL PATHOLOGY

## 2024-05-05 ENCOUNTER — Ambulatory Visit: Payer: Self-pay | Admitting: Gastroenterology

## 2024-05-05 LAB — VAS US ABI WITH/WO TBI
Left ABI: 0.85
Right ABI: 0.97

## 2024-05-23 ENCOUNTER — Encounter: Payer: Self-pay | Admitting: Cardiology

## 2024-05-24 ENCOUNTER — Other Ambulatory Visit (HOSPITAL_COMMUNITY): Payer: Self-pay

## 2024-05-24 ENCOUNTER — Telehealth: Payer: Self-pay | Admitting: Pharmacy Technician

## 2024-05-24 NOTE — Telephone Encounter (Signed)
 Patient has an active [employer] on file- notified patient he needs to call [employer] to speak with a rep to verify information before they will make it active again.  Income verification

## 2025-05-01 ENCOUNTER — Encounter (INDEPENDENT_AMBULATORY_CARE_PROVIDER_SITE_OTHER)

## 2025-05-01 ENCOUNTER — Ambulatory Visit (INDEPENDENT_AMBULATORY_CARE_PROVIDER_SITE_OTHER): Admitting: Vascular Surgery
